# Patient Record
Sex: Male | Born: 1985
Health system: Southern US, Community
[De-identification: ages and names within clinical notes are randomized; demographics above are authoritative.]

## PROBLEM LIST (undated history)

## (undated) DIAGNOSIS — S83529A Sprain of posterior cruciate ligament of unspecified knee, initial encounter: Secondary | ICD-10-CM

## (undated) DIAGNOSIS — J349 Unspecified disorder of nose and nasal sinuses: Secondary | ICD-10-CM

## (undated) DIAGNOSIS — T7840XA Allergy, unspecified, initial encounter: Secondary | ICD-10-CM

## (undated) HISTORY — DX: Unspecified disorder of nose and nasal sinuses: J34.9

## (undated) HISTORY — DX: Allergy, unspecified, initial encounter: T78.40XA

---

## 2014-01-24 ENCOUNTER — Emergency Department (HOSPITAL_COMMUNITY): Payer: Worker's Compensation

## 2014-01-24 ENCOUNTER — Observation Stay (HOSPITAL_COMMUNITY)
Admission: EM | Admit: 2014-01-24 | Discharge: 2014-01-25 | Disposition: A | Discharge status: Home / Self Care | Payer: Worker's Compensation | Attending: General Surgery | Admitting: General Surgery

## 2014-01-24 ENCOUNTER — Encounter (HOSPITAL_COMMUNITY): Payer: Self-pay | Admitting: Emergency Medicine

## 2014-01-24 DIAGNOSIS — S91009A Unspecified open wound, unspecified ankle, initial encounter: Secondary | ICD-10-CM

## 2014-01-24 DIAGNOSIS — Y92410 Unspecified street and highway as the place of occurrence of the external cause: Secondary | ICD-10-CM | POA: Insufficient documentation

## 2014-01-24 DIAGNOSIS — T148XXA Other injury of unspecified body region, initial encounter: Secondary | ICD-10-CM

## 2014-01-24 DIAGNOSIS — S81809A Unspecified open wound, unspecified lower leg, initial encounter: Secondary | ICD-10-CM

## 2014-01-24 DIAGNOSIS — S81009A Unspecified open wound, unspecified knee, initial encounter: Secondary | ICD-10-CM | POA: Insufficient documentation

## 2014-01-24 DIAGNOSIS — R109 Unspecified abdominal pain: Secondary | ICD-10-CM

## 2014-01-24 DIAGNOSIS — Z87891 Personal history of nicotine dependence: Secondary | ICD-10-CM | POA: Insufficient documentation

## 2014-01-24 DIAGNOSIS — S301XXA Contusion of abdominal wall, initial encounter: Principal | ICD-10-CM | POA: Insufficient documentation

## 2014-01-24 DIAGNOSIS — D62 Acute posthemorrhagic anemia: Secondary | ICD-10-CM | POA: Diagnosis present

## 2014-01-24 DIAGNOSIS — S300XXA Contusion of lower back and pelvis, initial encounter: Secondary | ICD-10-CM | POA: Diagnosis present

## 2014-01-24 DIAGNOSIS — S20229A Contusion of unspecified back wall of thorax, initial encounter: Secondary | ICD-10-CM | POA: Insufficient documentation

## 2014-01-24 DIAGNOSIS — M25559 Pain in unspecified hip: Secondary | ICD-10-CM | POA: Insufficient documentation

## 2014-01-24 DIAGNOSIS — M79609 Pain in unspecified limb: Secondary | ICD-10-CM | POA: Insufficient documentation

## 2014-01-24 LAB — CBC WITH DIFFERENTIAL/PLATELET
BASOS PCT: 0 % (ref 0–1)
Basophils Absolute: 0 10*3/uL (ref 0.0–0.1)
EOS ABS: 0 10*3/uL (ref 0.0–0.7)
EOS PCT: 0 % (ref 0–5)
HEMATOCRIT: 40.5 % (ref 39.0–52.0)
HEMOGLOBIN: 12.8 g/dL — AB (ref 13.0–17.0)
Lymphocytes Relative: 9 % — ABNORMAL LOW (ref 12–46)
Lymphs Abs: 0.9 10*3/uL (ref 0.7–4.0)
MCH: 27.1 pg (ref 26.0–34.0)
MCHC: 31.6 g/dL (ref 30.0–36.0)
MCV: 85.8 fL (ref 78.0–100.0)
MONOS PCT: 6 % (ref 3–12)
Monocytes Absolute: 0.7 10*3/uL (ref 0.1–1.0)
Neutro Abs: 9.5 10*3/uL — ABNORMAL HIGH (ref 1.7–7.7)
Neutrophils Relative %: 85 % — ABNORMAL HIGH (ref 43–77)
Platelets: 261 10*3/uL (ref 150–400)
RBC: 4.72 MIL/uL (ref 4.22–5.81)
RDW: 12.7 % (ref 11.5–15.5)
WBC: 11.1 10*3/uL — ABNORMAL HIGH (ref 4.0–10.5)

## 2014-01-24 LAB — PROTIME-INR
INR: 1.05 (ref 0.00–1.49)
PROTHROMBIN TIME: 13.5 s (ref 11.6–15.2)

## 2014-01-24 LAB — COMPREHENSIVE METABOLIC PANEL
ALT: 21 U/L (ref 0–53)
AST: 34 U/L (ref 0–37)
Albumin: 3.8 g/dL (ref 3.5–5.2)
Alkaline Phosphatase: 27 U/L — ABNORMAL LOW (ref 39–117)
BILIRUBIN TOTAL: 0.4 mg/dL (ref 0.3–1.2)
BUN: 10 mg/dL (ref 6–23)
CALCIUM: 8.7 mg/dL (ref 8.4–10.5)
CO2: 26 mEq/L (ref 19–32)
Chloride: 102 mEq/L (ref 96–112)
Creatinine, Ser: 0.96 mg/dL (ref 0.50–1.35)
GFR calc Af Amer: >90 mL/min (ref >90)
GFR calc non Af Amer: >90 mL/min (ref >90)
Glucose, Bld: 114 mg/dL — ABNORMAL HIGH (ref 70–99)
Potassium: 3.9 mEq/L (ref 3.7–5.3)
Sodium: 142 mEq/L (ref 137–147)
Total Protein: 7.1 g/dL (ref 6.0–8.3)

## 2014-01-24 LAB — CBC
HCT: 39.3 % (ref 39.0–52.0)
Hemoglobin: 12.3 g/dL — ABNORMAL LOW (ref 13.0–17.0)
MCH: 26.9 pg (ref 26.0–34.0)
MCHC: 31.3 g/dL (ref 30.0–36.0)
MCV: 86 fL (ref 78.0–100.0)
Platelets: 248 10*3/uL (ref 150–400)
RBC: 4.57 MIL/uL (ref 4.22–5.81)
RDW: 12.8 % (ref 11.5–15.5)
WBC: 8.5 10*3/uL (ref 4.0–10.5)

## 2014-01-24 LAB — URINALYSIS, ROUTINE W REFLEX MICROSCOPIC
Bilirubin Urine: NEGATIVE
GLUCOSE, UA: NEGATIVE mg/dL
HGB URINE DIPSTICK: NEGATIVE
Ketones, ur: NEGATIVE mg/dL
Leukocytes, UA: NEGATIVE
Nitrite: NEGATIVE
PROTEIN: NEGATIVE mg/dL
Specific Gravity, Urine: 1.01 (ref 1.005–1.030)
Urobilinogen, UA: 0.2 mg/dL (ref 0.0–1.0)
pH: 6 (ref 5.0–8.0)

## 2014-01-24 LAB — APTT: aPTT: 27 seconds (ref 24–37)

## 2014-01-24 LAB — LACTIC ACID, PLASMA: Lactic Acid, Venous: 1.9 mmol/L (ref 0.5–2.2)

## 2014-01-24 LAB — I-STAT CG4 LACTIC ACID, ED: Lactic Acid, Venous: 3.34 mmol/L — ABNORMAL HIGH (ref 0.5–2.2)

## 2014-01-24 MED ORDER — ONDANSETRON HCL 4 MG PO TABS
4.0000 mg | ORAL_TABLET | Freq: Four times a day (QID) | ORAL | Status: DC | PRN
  Administered 2014-01-25: 4 mg via ORAL
  Filled 2014-01-24: qty 1

## 2014-01-24 MED ORDER — MORPHINE SULFATE 4 MG/ML IJ SOLN
4.0000 mg | Freq: Once | INTRAMUSCULAR | Status: AC
  Administered 2014-01-24: 4 mg via INTRAVENOUS
  Filled 2014-01-24: qty 1

## 2014-01-24 MED ORDER — IOHEXOL 300 MG/ML  SOLN
100.0000 mL | Freq: Once | INTRAMUSCULAR | Status: AC | PRN
  Administered 2014-01-24: 100 mL via INTRAVENOUS

## 2014-01-24 MED ORDER — SODIUM CHLORIDE 0.9 % IV BOLUS (SEPSIS)
1000.0000 mL | Freq: Once | INTRAVENOUS | Status: AC
  Administered 2014-01-24: 1000 mL via INTRAVENOUS

## 2014-01-24 MED ORDER — ONDANSETRON HCL 4 MG/2ML IJ SOLN: 4.0000 mg | Freq: Four times a day (QID) | INTRAMUSCULAR | Status: DC | PRN

## 2014-01-24 MED ORDER — PANTOPRAZOLE SODIUM 40 MG PO TBEC
40.0000 mg | DELAYED_RELEASE_TABLET | Freq: Every day | ORAL | Status: DC
  Administered 2014-01-24: 40 mg via ORAL
  Filled 2014-01-24: qty 1

## 2014-01-24 MED ORDER — MORPHINE SULFATE 2 MG/ML IJ SOLN
2.0000 mg | INTRAMUSCULAR | Status: DC | PRN
  Administered 2014-01-24 – 2014-01-25 (×3): 2 mg via INTRAVENOUS
  Filled 2014-01-24 (×3): qty 1

## 2014-01-24 MED ORDER — KCL IN DEXTROSE-NACL 20-5-0.9 MEQ/L-%-% IV SOLN
INTRAVENOUS | Status: DC
  Administered 2014-01-24: 22:00:00 via INTRAVENOUS
  Filled 2014-01-24 (×2): qty 1000

## 2014-01-24 MED ORDER — PANTOPRAZOLE SODIUM 40 MG IV SOLR
40.0000 mg | Freq: Every day | INTRAVENOUS | Status: DC
  Filled 2014-01-24: qty 40

## 2014-01-24 NOTE — ED Notes (Signed)
The  tech cleaned the right knee and applied a clean dressing. The tech has reported to the RN in charge.
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
## 2014-01-24 NOTE — ED Notes (Signed)
Dr. Patria Maneampos notified of elevated CG-4

## 2014-01-24 NOTE — ED Notes (Signed)
Patient complaining of feeling lightheaded and dizzy.  He just submitted a urine sample for the lab.  Describes sensation as "head is floating" and "room is spinning".
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
## 2014-01-24 NOTE — ED Notes (Signed)
Lab is at the bedside to collect urine sample.

## 2014-01-24 NOTE — ED Notes (Signed)
Gave the patient juice to drink.

## 2014-01-24 NOTE — ED Notes (Signed)
Discussed with Dr. Clearance CootsHarper that patient is complaining of dizziness and lightheadedness. MD acknowledges and will enter orders.

## 2014-01-24 NOTE — ED Notes (Signed)
Trauma surgeon at the bedside. 
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
## 2014-01-25 ENCOUNTER — Encounter (INDEPENDENT_AMBULATORY_CARE_PROVIDER_SITE_OTHER): Payer: Self-pay | Admitting: Orthopedic Surgery

## 2014-01-25 ENCOUNTER — Encounter (HOSPITAL_COMMUNITY): Payer: Self-pay

## 2014-01-25 DIAGNOSIS — S300XXA Contusion of lower back and pelvis, initial encounter: Secondary | ICD-10-CM | POA: Diagnosis present

## 2014-01-25 DIAGNOSIS — D62 Acute posthemorrhagic anemia: Secondary | ICD-10-CM | POA: Diagnosis present

## 2014-01-25 LAB — COMPREHENSIVE METABOLIC PANEL
ALBUMIN: 3.3 g/dL — AB (ref 3.5–5.2)
ALBUMIN: 3.1 g/dL — AB (ref 3.5–5.2)
ALK PHOS: 18 U/L — AB (ref 39–117)
ALT: 20 U/L (ref 0–53)
ALT: 18 U/L (ref 0–53)
AST: 36 U/L (ref 0–37)
AST: 34 U/L (ref 0–37)
Alkaline Phosphatase: 22 U/L — ABNORMAL LOW (ref 39–117)
BILIRUBIN TOTAL: 0.5 mg/dL (ref 0.3–1.2)
BILIRUBIN TOTAL: 0.7 mg/dL (ref 0.3–1.2)
BUN: 9 mg/dL (ref 6–23)
BUN: 7 mg/dL (ref 6–23)
CALCIUM: 8.6 mg/dL (ref 8.4–10.5)
CHLORIDE: 102 meq/L (ref 96–112)
CO2: 26 mEq/L (ref 19–32)
CO2: 26 mEq/L (ref 19–32)
CREATININE: 0.98 mg/dL (ref 0.50–1.35)
Calcium: 8.3 mg/dL — ABNORMAL LOW (ref 8.4–10.5)
Chloride: 105 mEq/L (ref 96–112)
Creatinine, Ser: 0.99 mg/dL (ref 0.50–1.35)
GFR calc Af Amer: >90 mL/min (ref >90)
GFR calc Af Amer: >90 mL/min (ref >90)
GFR calc non Af Amer: >90 mL/min (ref >90)
GFR calc non Af Amer: >90 mL/min (ref >90)
GLUCOSE: 132 mg/dL — AB (ref 70–99)
Glucose, Bld: 125 mg/dL — ABNORMAL HIGH (ref 70–99)
POTASSIUM: 4.3 meq/L (ref 3.7–5.3)
Potassium: 4.4 mEq/L (ref 3.7–5.3)
Sodium: 141 mEq/L (ref 137–147)
Sodium: 142 mEq/L (ref 137–147)
Total Protein: 6.4 g/dL (ref 6.0–8.3)
Total Protein: 6.1 g/dL (ref 6.0–8.3)

## 2014-01-25 LAB — CBC
HEMATOCRIT: 37.7 % — AB (ref 39.0–52.0)
HEMOGLOBIN: 12 g/dL — AB (ref 13.0–17.0)
MCH: 27.1 pg (ref 26.0–34.0)
MCHC: 31.8 g/dL (ref 30.0–36.0)
MCV: 85.1 fL (ref 78.0–100.0)
Platelets: 238 10*3/uL (ref 150–400)
RBC: 4.43 MIL/uL (ref 4.22–5.81)
RDW: 13 % (ref 11.5–15.5)
WBC: 6.9 10*3/uL (ref 4.0–10.5)

## 2014-01-25 MED ORDER — MORPHINE SULFATE 2 MG/ML IJ SOLN: 2.0000 mg | INTRAMUSCULAR | Status: DC | PRN

## 2014-01-25 MED ORDER — BACITRACIN-NEOMYCIN-POLYMYXIN 400-5-5000 EX OINT
TOPICAL_OINTMENT | CUTANEOUS | Status: AC
  Administered 2014-01-25: 14:00:00
  Filled 2014-01-25: qty 1

## 2014-01-25 MED ORDER — NAPROXEN 500 MG PO TABS: 500.0000 mg | ORAL_TABLET | Freq: Two times a day (BID) | ORAL | Status: DC

## 2014-01-25 MED ORDER — TRAMADOL HCL 50 MG PO TABS: 50.0000 mg | ORAL_TABLET | Freq: Four times a day (QID) | ORAL | Status: DC | PRN

## 2014-01-25 MED ORDER — TRAMADOL HCL 50 MG PO TABS
50.0000 mg | ORAL_TABLET | Freq: Four times a day (QID) | ORAL | Status: DC | PRN
  Administered 2014-01-25 (×2): 100 mg via ORAL
  Filled 2014-01-25 (×2): qty 2

## 2014-01-25 MED ORDER — NAPROXEN 500 MG PO TABS
500.0000 mg | ORAL_TABLET | Freq: Two times a day (BID) | ORAL | Status: DC
  Administered 2014-01-25: 500 mg via ORAL
  Filled 2014-01-25 (×4): qty 1
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
## 2014-01-25 NOTE — Progress Notes (Signed)
While awaiting discharge, pt. Vomited large amt of undigested food. Notified Charma IgoMichael Jeffery, PA-C. Received order to proceed with discharge. Administered Zofran tablet prior to leaving the unit. While transporting to his car the patient vomited approximately 400-500 cc of emesis. He admitted to "overeating" a heavy lunch but also felt his nausea was related to feeling flushed and hot and was worse with ambulation. Informed patient to return to the ED if the nausea continued. Discharged home accompanied by girlfriend and family members. Malen Gauzearol Emmalee Solivan, RN

## 2014-01-25 NOTE — Progress Notes (Signed)
Agree that the patient should be able to go home.  This patient has been seen and I agree with the findings and treatment plan.  Marta LamasJames O. Gae BonWyatt, III, MD, FACS [phone] (pager) [phone] (direct pager) Trauma Surgeon
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
## 2014-02-06 ENCOUNTER — Telehealth (HOSPITAL_COMMUNITY): Payer: Self-pay

## 2014-02-07 ENCOUNTER — Ambulatory Visit (INDEPENDENT_AMBULATORY_CARE_PROVIDER_SITE_OTHER): Payer: Worker's Compensation | Admitting: General Surgery

## 2014-02-07 ENCOUNTER — Encounter (INDEPENDENT_AMBULATORY_CARE_PROVIDER_SITE_OTHER): Payer: Self-pay

## 2014-02-07 VITALS — BP 130/80 | HR 78 | Temp 97.0°F | Ht 70.0 in | Wt 238.0 lb

## 2014-02-07 DIAGNOSIS — M549 Dorsalgia, unspecified: Secondary | ICD-10-CM

## 2014-02-07 DIAGNOSIS — M25569 Pain in unspecified knee: Secondary | ICD-10-CM
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
## 2014-02-07 NOTE — Telephone Encounter (Signed)
Left message
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
## 2014-02-08 ENCOUNTER — Other Ambulatory Visit: Payer: Self-pay | Admitting: Occupational Medicine

## 2014-02-08 ENCOUNTER — Ambulatory Visit: Payer: Worker's Compensation

## 2014-02-08 DIAGNOSIS — R52 Pain, unspecified: Secondary | ICD-10-CM

## 2015-01-04 ENCOUNTER — Emergency Department (HOSPITAL_COMMUNITY)
Admission: EM | Admit: 2015-01-04 | Discharge: 2015-01-04 | Disposition: A | Discharge status: Home / Self Care | Payer: BLUE CROSS/BLUE SHIELD | Attending: Emergency Medicine | Admitting: Emergency Medicine

## 2015-01-04 ENCOUNTER — Encounter (HOSPITAL_COMMUNITY): Payer: Self-pay | Admitting: Emergency Medicine

## 2015-01-04 DIAGNOSIS — R42 Dizziness and giddiness: Secondary | ICD-10-CM | POA: Insufficient documentation

## 2015-01-04 DIAGNOSIS — Z87828 Personal history of other (healed) physical injury and trauma: Secondary | ICD-10-CM | POA: Diagnosis not present

## 2015-01-04 DIAGNOSIS — Z791 Long term (current) use of non-steroidal anti-inflammatories (NSAID): Secondary | ICD-10-CM | POA: Diagnosis not present

## 2015-01-04 DIAGNOSIS — R112 Nausea with vomiting, unspecified: Secondary | ICD-10-CM | POA: Diagnosis not present

## 2015-01-04 HISTORY — DX: Sprain of posterior cruciate ligament of unspecified knee, initial encounter: S83.529A

## 2015-01-04 LAB — COMPREHENSIVE METABOLIC PANEL
ALT: 27 U/L (ref 0–53)
AST: 21 U/L (ref 0–37)
Albumin: 3.8 g/dL (ref 3.5–5.2)
Alkaline Phosphatase: 17 U/L — ABNORMAL LOW (ref 39–117)
Anion gap: 7 (ref 5–15)
BILIRUBIN TOTAL: 0.8 mg/dL (ref 0.3–1.2)
BUN: 10 mg/dL (ref 6–23)
CO2: 29 mmol/L (ref 19–32)
Calcium: 9.3 mg/dL (ref 8.4–10.5)
Chloride: 104 mmol/L (ref 96–112)
Creatinine, Ser: 1.01 mg/dL (ref 0.50–1.35)
GFR calc Af Amer: >90 mL/min (ref >90)
Glucose, Bld: 110 mg/dL — ABNORMAL HIGH (ref 70–99)
Potassium: 4.2 mmol/L (ref 3.5–5.1)
Sodium: 140 mmol/L (ref 135–145)
Total Protein: 6.9 g/dL (ref 6.0–8.3)

## 2015-01-04 LAB — CBC WITH DIFFERENTIAL/PLATELET
Basophils Absolute: 0 10*3/uL (ref 0.0–0.1)
Basophils Relative: 0 % (ref 0–1)
EOS ABS: 0 10*3/uL (ref 0.0–0.7)
EOS PCT: 0 % (ref 0–5)
HCT: 42.1 % (ref 39.0–52.0)
Hemoglobin: 13.3 g/dL (ref 13.0–17.0)
LYMPHS ABS: 1 10*3/uL (ref 0.7–4.0)
Lymphocytes Relative: 21 % (ref 12–46)
MCH: 26.6 pg (ref 26.0–34.0)
MCHC: 31.6 g/dL (ref 30.0–36.0)
MCV: 84.2 fL (ref 78.0–100.0)
Monocytes Absolute: 0.3 10*3/uL (ref 0.1–1.0)
Monocytes Relative: 6 % (ref 3–12)
Neutro Abs: 3.6 10*3/uL (ref 1.7–7.7)
Neutrophils Relative %: 73 % (ref 43–77)
PLATELETS: 342 10*3/uL (ref 150–400)
RBC: 5 MIL/uL (ref 4.22–5.81)
RDW: 13 % (ref 11.5–15.5)
WBC: 5 10*3/uL (ref 4.0–10.5)

## 2015-01-04 LAB — URINALYSIS, ROUTINE W REFLEX MICROSCOPIC
BILIRUBIN URINE: NEGATIVE
GLUCOSE, UA: NEGATIVE mg/dL
Hgb urine dipstick: NEGATIVE
KETONES UR: NEGATIVE mg/dL
Leukocytes, UA: NEGATIVE
Nitrite: NEGATIVE
PH: 7 (ref 5.0–8.0)
Protein, ur: NEGATIVE mg/dL
SPECIFIC GRAVITY, URINE: 1.023 (ref 1.005–1.030)
Urobilinogen, UA: 0.2 mg/dL (ref 0.0–1.0)

## 2015-01-04 LAB — RAPID URINE DRUG SCREEN, HOSP PERFORMED
Amphetamines: NOT DETECTED
BENZODIAZEPINES: NOT DETECTED
Barbiturates: NOT DETECTED
COCAINE: NOT DETECTED
Opiates: NOT DETECTED
TETRAHYDROCANNABINOL: NOT DETECTED

## 2015-01-04 LAB — LIPASE, BLOOD: Lipase: 26 U/L (ref 11–59)

## 2015-01-04 LAB — CK: CK TOTAL: 114 U/L (ref 7–232)

## 2015-01-04 MED ORDER — MECLIZINE HCL 12.5 MG PO TABS: 12.5000 mg | ORAL_TABLET | Freq: Three times a day (TID) | ORAL | Status: DC | PRN

## 2015-01-04 MED ORDER — ONDANSETRON HCL 4 MG/2ML IJ SOLN
4.0000 mg | Freq: Once | INTRAMUSCULAR | Status: AC
  Administered 2015-01-04: 4 mg via INTRAVENOUS
  Filled 2015-01-04: qty 2

## 2015-01-04 MED ORDER — SODIUM CHLORIDE 0.9 % IV BOLUS (SEPSIS)
1000.0000 mL | Freq: Once | INTRAVENOUS | Status: AC
  Administered 2015-01-04: 1000 mL via INTRAVENOUS

## 2015-01-04 MED ORDER — ONDANSETRON 4 MG PO TBDP: 4.0000 mg | ORAL_TABLET | Freq: Three times a day (TID) | ORAL | Status: DC | PRN

## 2015-01-04 MED ORDER — MECLIZINE HCL 25 MG PO TABS
25.0000 mg | ORAL_TABLET | Freq: Once | ORAL | Status: AC
  Administered 2015-01-04: 25 mg via ORAL
  Filled 2015-01-04: qty 1

## 2015-01-04 NOTE — Discharge Instructions (Signed)
Dizziness °Dizziness is a common problem. It is a feeling of unsteadiness or light-headedness. You may feel like you are about to faint. Dizziness can lead to injury if you stumble or fall. A person of any age group can suffer from dizziness, but dizziness is more common in older adults. °CAUSES  °Dizziness can be caused by many different things, including: °· Middle ear problems. °· Standing for too long. °· Infections. °· An allergic reaction. °· Aging. °· An emotional response to something, such as the sight of blood. °· Side effects of medicines. °· Tiredness. °· Problems with circulation or blood pressure. °· Excessive use of alcohol or medicines, or illegal drug use. °· Breathing too fast (hyperventilation). °· An irregular heart rhythm (arrhythmia). °· A low red blood cell count (anemia). °· Pregnancy. °· Vomiting, diarrhea, fever, or other illnesses that cause body fluid loss (dehydration). °· Diseases or conditions such as Parkinson's disease, high blood pressure (hypertension), diabetes, and thyroid problems. °· Exposure to extreme heat. °DIAGNOSIS  °Your health care provider will ask about your symptoms, perform a physical exam, and perform an electrocardiogram (ECG) to record the electrical activity of your heart. Your health care provider may also perform other heart or blood tests to determine the cause of your dizziness. These may include: °· Transthoracic echocardiogram (TTE). During echocardiography, sound waves are used to evaluate how blood flows through your heart. °· Transesophageal echocardiogram (TEE). °· Cardiac monitoring. This allows your health care provider to monitor your heart rate and rhythm in real time. °· Holter monitor. This is a portable device that records your heartbeat and can help diagnose heart arrhythmias. It allows your health care provider to track your heart activity for several days if needed. °· Stress tests by exercise or by giving medicine that makes the heart beat  faster. °TREATMENT  °Treatment of dizziness depends on the cause of your symptoms and can vary greatly. °HOME CARE INSTRUCTIONS  °· Drink enough fluids to keep your urine clear or pale yellow. This is especially important in very hot weather. In older adults, it is also important in cold weather. °· Take your medicine exactly as directed if your dizziness is caused by medicines. When taking blood pressure medicines, it is especially important to get up slowly. °¨ Rise slowly from chairs and steady yourself until you feel okay. °¨ In the morning, first sit up on the side of the bed. When you feel okay, stand slowly while holding onto something until you know your balance is fine. °· Move your legs often if you need to stand in one place for a long time. Tighten and relax your muscles in your legs while standing. °· Have someone stay with you for 1-2 days if dizziness continues to be a problem. Do this until you feel you are well enough to stay alone. Have the person call your health care provider if he or she notices changes in you that are concerning. °· Do not drive or use heavy machinery if you feel dizzy. °· Do not drink alcohol. °SEEK IMMEDIATE MEDICAL CARE IF:  °· Your dizziness or light-headedness gets worse. °· You feel nauseous or vomit. °· You have problems talking, walking, or using your arms, hands, or legs. °· You feel weak. °· You are not thinking clearly or you have trouble forming sentences. It may take a friend or family member to notice this. °· You have chest pain, abdominal pain, shortness of breath, or sweating. °· Your vision changes. °· You notice   any bleeding. °· You have side effects from medicine that seems to be getting worse rather than better. °MAKE SURE YOU:  °· Understand these instructions. °· Will watch your condition. °· Will get help right away if you are not doing well or get worse. °Document Released: 03/17/2001 Document Revised: 09/26/2013 Document Reviewed: 04/10/2011 °ExitCare®  Patient Information ©2015 [employer]. This information is not intended to replace advice given to you by your health care provider. Make sure you discuss any questions you have with your health care provider. °Nausea and Vomiting °Nausea is a sick feeling that often comes before throwing up (vomiting). Vomiting is a reflex where stomach contents come out of your mouth. Vomiting can cause severe loss of body fluids (dehydration). Children and elderly adults can become dehydrated quickly, especially if they also have diarrhea. Nausea and vomiting are symptoms of a condition or disease. It is important to find the cause of your symptoms. °CAUSES  °· Direct irritation of the stomach lining. This irritation can result from increased acid production (gastroesophageal reflux disease), infection, food poisoning, taking certain medicines (such as nonsteroidal anti-inflammatory drugs), alcohol use, or tobacco use. °· Signals from the brain. These signals could be caused by a headache, heat exposure, an inner ear disturbance, increased pressure in the brain from injury, infection, a tumor, or a concussion, pain, emotional stimulus, or metabolic problems. °· An obstruction in the gastrointestinal tract (bowel obstruction). °· Illnesses such as diabetes, hepatitis, gallbladder problems, appendicitis, kidney problems, cancer, sepsis, atypical symptoms of a heart attack, or eating disorders. °· Medical treatments such as chemotherapy and radiation. °· Receiving medicine that makes you sleep (general anesthetic) during surgery. °DIAGNOSIS °Your caregiver may ask for tests to be done if the problems do not improve after a few days. Tests may also be done if symptoms are severe or if the reason for the nausea and vomiting is not clear. Tests may include: °· Urine tests. °· Blood tests. °· Stool tests. °· Cultures (to look for evidence of infection). °· X-rays or other imaging studies. °Test results can help your caregiver make  decisions about treatment or the need for additional tests. °TREATMENT °You need to stay well hydrated. Drink frequently but in small amounts. You may wish to drink water, sports drinks, clear broth, or eat frozen ice pops or gelatin dessert to help stay hydrated. When you eat, eating slowly may help prevent nausea. There are also some antinausea medicines that may help prevent nausea. °HOME CARE INSTRUCTIONS  °· Take all medicine as directed by your caregiver. °· If you do not have an appetite, do not force yourself to eat. However, you must continue to drink fluids. °· If you have an appetite, eat a normal diet unless your caregiver tells you differently. °¨ Eat a variety of complex carbohydrates (rice, wheat, potatoes, bread), lean meats, yogurt, fruits, and vegetables. °¨ Avoid high-fat foods because they are more difficult to digest. °· Drink enough water and fluids to keep your urine clear or pale yellow. °· If you are dehydrated, ask your caregiver for specific rehydration instructions. Signs of dehydration may include: °¨ Severe thirst. °¨ Dry lips and mouth. °¨ Dizziness. °¨ Dark urine. °¨ Decreasing urine frequency and amount. °¨ Confusion. °¨ Rapid breathing or pulse. °SEEK IMMEDIATE MEDICAL CARE IF:  °· You have blood or brown flecks (like coffee grounds) in your vomit. °· You have black or bloody stools. °· You have a severe headache or stiff neck. °· You are confused. °· You have severe abdominal pain. °·   You have chest pain or trouble breathing. °· You do not urinate at least once every 8 hours. °· You develop cold or clammy skin. °· You continue to vomit for longer than 24 to 48 hours. °· You have a fever. °MAKE SURE YOU:  °· Understand these instructions. °· Will watch your condition. °· Will get help right away if you are not doing well or get worse. °Document Released: 09/21/2005 Document Revised: 12/14/2011 Document Reviewed: 02/18/2011 °ExitCare® Patient Information ©2015 [employer]. This  information is not intended to replace advice given to you by your health care provider. Make sure you discuss any questions you have with your health care provider. ° °

## 2015-01-04 NOTE — ED Notes (Signed)
Pt states started feeeling dizzy yesterday, worse this am, with vomiting with movement. No diarrhea, no appetite,

## 2015-01-04 NOTE — ED Notes (Signed)
Patient able to ambulate without difficulty. Pt has steady gait. Denies lightheadedness or dizziness.
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
## 2015-01-04 NOTE — ED Notes (Signed)
Patient reevaluated.   Patient states feels same as when he came in.

## 2015-01-04 NOTE — ED Notes (Signed)
Able to tolerate PO fluids.

## 2015-04-20 ENCOUNTER — Emergency Department (HOSPITAL_COMMUNITY)
Admission: EM | Admit: 2015-04-20 | Discharge: 2015-04-20 | Disposition: A | Discharge status: Home / Self Care | Payer: BLUE CROSS/BLUE SHIELD | Attending: Emergency Medicine | Admitting: Emergency Medicine

## 2015-04-20 ENCOUNTER — Emergency Department (HOSPITAL_COMMUNITY): Payer: BLUE CROSS/BLUE SHIELD

## 2015-04-20 ENCOUNTER — Encounter (HOSPITAL_COMMUNITY): Payer: Self-pay | Admitting: *Deleted

## 2015-04-20 DIAGNOSIS — S62002A Unspecified fracture of navicular [scaphoid] bone of left wrist, initial encounter for closed fracture: Secondary | ICD-10-CM | POA: Diagnosis not present

## 2015-04-20 DIAGNOSIS — Y92310 Basketball court as the place of occurrence of the external cause: Secondary | ICD-10-CM | POA: Diagnosis not present

## 2015-04-20 DIAGNOSIS — Y998 Other external cause status: Secondary | ICD-10-CM | POA: Diagnosis not present

## 2015-04-20 DIAGNOSIS — Y9367 Activity, basketball: Secondary | ICD-10-CM | POA: Diagnosis not present

## 2015-04-20 DIAGNOSIS — W2105XA Struck by basketball, initial encounter: Secondary | ICD-10-CM | POA: Insufficient documentation

## 2015-04-20 DIAGNOSIS — S62002S Unspecified fracture of navicular [scaphoid] bone of left wrist, sequela: Secondary | ICD-10-CM

## 2015-04-20 DIAGNOSIS — S6992XA Unspecified injury of left wrist, hand and finger(s), initial encounter: Secondary | ICD-10-CM | POA: Diagnosis present

## 2015-04-20 NOTE — Discharge Instructions (Signed)
Where your wrist splint until you no longer have pain. Follow-up with Dr. Amanda PeaGramig if you continue to have pain. Take Tylenol or Motrin for pain.

## 2015-04-20 NOTE — ED Provider Notes (Signed)
CSN: [id number]     Arrival date & time 04/20/15  1706 History  This chart was scribed for Catha Gosselin, PA-C, working with Samuel Jester, DO by Elon Spanner, ED Scribe. This patient was seen in room TR08C/TR08C and the patient's care was started at 5:41 PM.   Chief Complaint  Patient presents with  . Hand Injury   The history is provided by the patient. No language interpreter was used.   HPI Comments: Anthony Durham is a 29 y.o. male who presents to the Emergency Department complaining of a left wrist injury onset 4 hours ago with associated constant aching pain.  The patient reports he was playing basketball when he fell and landed on his outstretched right hand.  He has not taken anything for pain.  Patient reports a prior history of left wrist fracture, but denies surgical history.    PCP: none   Past Medical History  Diagnosis Date  . Tear of PCL (posterior cruciate ligament) of knee    History reviewed. No pertinent past surgical history. History reviewed. No pertinent family history. History  Substance Use Topics  . Smoking status: Never Smoker   . Smokeless tobacco: Never Used  . Alcohol Use: No    Review of Systems  Constitutional: Negative for fever.  Musculoskeletal: Positive for joint swelling and arthralgias.      Allergies  Review of patient's allergies indicates no known allergies.  Home Medications   Prior to Admission medications   Not on File   BP 112/75 mmHg  Pulse 68  Temp(Src) 98.9 F (37.2 C) (Oral)  Resp 14  SpO2 100% Physical Exam  Constitutional: He is oriented to person, place, and time. He appears well-developed and well-nourished. No distress.  HENT:  Head: Normocephalic and atraumatic.  Eyes: Conjunctivae and EOM are normal.  Neck: Neck supple. No tracheal deviation present.  Cardiovascular: Normal rate.   Good radial pulses.   Pulmonary/Chest: Effort normal. No respiratory distress.  Musculoskeletal: Normal range of motion.   Left hand: Able to flex/extend fingers without difficulty.  No snuff box tenderness.  No pallor.  Neurological: He is alert and oriented to person, place, and time.  Strength 5/5 in left hand.  NVI.  Skin: Skin is warm and dry.  Psychiatric: He has a normal mood and affect. His behavior is normal.  Nursing note and vitals reviewed.   ED Course  Procedures (including critical care time)  DIAGNOSTIC STUDIES: Oxygen Saturation is 98% on RA, normal by my interpretation.    COORDINATION OF CARE:  5:54 PM Discussed treatment plan with patient at bedside.  Patient acknowledges and agrees with plan.    Labs Review Labs Reviewed - No data to display  Imaging Review Dg Wrist Complete Left  04/20/2015   CLINICAL DATA:  Basketball injury.  EXAM: LEFT WRIST - COMPLETE 3+ VIEW  COMPARISON:  None.  FINDINGS: There is chronic appearing fracture deformity involving the scaphoid bone. Mild distraction of the fracture fragments noted. There is increased density of the distal pole which may indicate necrosis. No acute fractures or subluxations.  IMPRESSION: 1. Fracture involving the waist of the scaphoid bone is likely chronic with possible necrosis of the distal pole. No acute fractures or subluxations noted.   Electronically Signed   By: Signa Kell M.D.   On: 04/20/2015 17:55     EKG Interpretation None      MDM   Final diagnoses:  Scaphoid fracture of wrist, left, sequela  Patient is not in  any acute distress. He was given an ice pack. He refused pain medications. Exam was normal. Patient was seen by Dr. Amanda PeaGramig due to scaphoid fracture. He suggested putting the patient in a left wrist immobilizer until he no longer had pain. He also gave the patient follow-up if he continued to have problems with the wrist.  I personally performed the services described in this documentation, which was scribed in my presence. The recorded information has been reviewed and is accurate.   Catha GosselinHanna  Patel-Mills, PA-C 04/21/15 0155  Samuel JesterKathleen McManus, DO 04/23/15 2208

## 2015-04-20 NOTE — ED Notes (Signed)
Pt reports playing ball and falling, now has left wrist pain. No obv injury noted.

## 2015-04-20 NOTE — Consult Note (Signed)
Reason for Consult: Left wrist pain Referring Physician: ER staff  Carlynn SpryJazman Walrath is an 29 y.o. male.  HPI: Patient presents with left wrist pain after follow-up plain outdoor activities. He denies locking popping catching. He states she has pain about the dorsal radial aspect of his wrist.  He has a remote history of injury and fracture. He states when he was playing high school football he was casted for a scaphoid fracture. This appears to never healed and are pinning his medical professionals today.  He does not have any gross swelling or numbness. He denies other injury. He and I discussed these issues at length.  It appears that he had a remote injury and was reinjured today.  It appears she is always a little bit of problem with the wrist but certainly worsening did occur today after the fall.  He states he works for past [employer]. He is a good historian  Past Medical History  Diagnosis Date  . Tear of PCL (posterior cruciate ligament) of knee     History reviewed. No pertinent past surgical history.  History reviewed. No pertinent family history.  Social History:  reports that he has never smoked. He has never used smokeless tobacco. He reports that he does not drink alcohol or use illicit drugs.  Allergies: No Known Allergies  Medications: I have reviewed the patient's current medications.  No results found for this or any previous visit (from the past 48 hour(s)).  Dg Wrist Complete Left  04/20/2015   CLINICAL DATA:  Basketball injury.  EXAM: LEFT WRIST - COMPLETE 3+ VIEW  COMPARISON:  None.  FINDINGS: There is chronic appearing fracture deformity involving the scaphoid bone. Mild distraction of the fracture fragments noted. There is increased density of the distal pole which may indicate necrosis. No acute fractures or subluxations.  IMPRESSION: 1. Fracture involving the waist of the scaphoid bone is likely chronic with possible necrosis of the distal pole. No acute  fractures or subluxations noted.   Electronically Signed   By: Signa Kellaylor  Stroud M.D.   On: 04/20/2015 17:55    ROS Blood pressure 121/74, pulse 88, temperature 98.2 F (36.8 C), temperature source Oral, resp. rate 20, SpO2 98 %. Physical Exam left wrist is intact sensation and motor function. There's no signs of compartment and rhythm or infection. There is no evidence of instability. I reviewed this with him at length and the findings. He is mildly tender over the snuffbox. CMC joints are stable. Elbow and forearm are nontender.  The patient is alert and oriented in no acute distress. The patient complains of pain in the affected upper extremity.  The patient is noted to have a normal HEENT exam. Lung fields show equal chest expansion and no shortness of breath. Abdomen exam is nontender without distention. Lower extremity examination does not show any fracture dislocation or blood clot symptoms. Pelvis is stable and the neck and back are stable and nontender.  Assessment/Plan: Scaphoid nonunion left wrist. This patient has early proximal and distal pole necrosis with degenerative change. I brought up the x-rays in the computer and showed the x-rays to the patient. I discussed with him that unfortunately he had a bone which was broken years ago and ultimately never healed. This ultimately change the dynamics of his wrist. He now has a poor balance in terms of his wrist stability issue.  I discussed in that unfortunately he has early cartilage wear and what appears to be a scaphoid which does not have  good blood flow. I went over the precarious chair blood flow to the scaphoid and its tendency towards nonunion. I discussed with him options.  I feel that he has reinjured an old injury. This would be known as a acute exacerbation of his chronic condition.  At present time I recommend immobilization, Rice( rest ice compression elevation), and a see if in fact this returns to its usual state of  affairs.  If he continues to have pain and problems one would have to consider a scaphoid excision and 4 corner fusion. Other optional be a proximal row carpectomy. I do not feel he would be a good candidate for attempted scaphoid reconstruction given the timeframe duration from injury and the changes in his scaphoid poles as well as early degenerative change.  We will do this at length. I gave him my business card and wrote down his diagnosis so that he could  familiarize himself with the diagnosis and options.    Karen Chafe 04/20/2015, 7:47 PM

## 2016-03-26 ENCOUNTER — Other Ambulatory Visit: Payer: Self-pay

## 2016-03-26 ENCOUNTER — Emergency Department (HOSPITAL_COMMUNITY)
Admission: EM | Admit: 2016-03-26 | Discharge: 2016-03-26 | Disposition: A | Discharge status: Home / Self Care | Payer: BLUE CROSS/BLUE SHIELD | Attending: Emergency Medicine | Admitting: Emergency Medicine

## 2016-03-26 ENCOUNTER — Emergency Department (HOSPITAL_COMMUNITY): Payer: BLUE CROSS/BLUE SHIELD

## 2016-03-26 ENCOUNTER — Encounter (HOSPITAL_COMMUNITY): Payer: Self-pay | Admitting: Emergency Medicine

## 2016-03-26 DIAGNOSIS — R112 Nausea with vomiting, unspecified: Secondary | ICD-10-CM

## 2016-03-26 DIAGNOSIS — R509 Fever, unspecified: Secondary | ICD-10-CM | POA: Diagnosis not present

## 2016-03-26 DIAGNOSIS — R55 Syncope and collapse: Secondary | ICD-10-CM

## 2016-03-26 DIAGNOSIS — R1084 Generalized abdominal pain: Secondary | ICD-10-CM | POA: Diagnosis not present

## 2016-03-26 LAB — HEPATIC FUNCTION PANEL
ALT: 30 U/L (ref 17–63)
AST: 21 U/L (ref 15–41)
Albumin: 4.1 g/dL (ref 3.5–5.0)
Alkaline Phosphatase: 16 U/L — ABNORMAL LOW (ref 38–126)
Bilirubin, Direct: <0.1 mg/dL — ABNORMAL LOW (ref 0.1–0.5)
TOTAL PROTEIN: 7.1 g/dL (ref 6.5–8.1)
Total Bilirubin: 1.1 mg/dL (ref 0.3–1.2)

## 2016-03-26 LAB — BASIC METABOLIC PANEL
Anion gap: 9 (ref 5–15)
BUN: 12 mg/dL (ref 6–20)
CO2: 27 mmol/L (ref 22–32)
CREATININE: 1.17 mg/dL (ref 0.61–1.24)
Calcium: 8.9 mg/dL (ref 8.9–10.3)
Chloride: 103 mmol/L (ref 101–111)
GFR calc Af Amer: >60 mL/min (ref >60)
Glucose, Bld: 99 mg/dL (ref 65–99)
Potassium: 3.5 mmol/L (ref 3.5–5.1)
SODIUM: 139 mmol/L (ref 135–145)

## 2016-03-26 LAB — CBC
HCT: 43.6 % (ref 39.0–52.0)
Hemoglobin: 13.9 g/dL (ref 13.0–17.0)
MCH: 26.2 pg (ref 26.0–34.0)
MCHC: 31.9 g/dL (ref 30.0–36.0)
MCV: 82.3 fL (ref 78.0–100.0)
PLATELETS: 236 10*3/uL (ref 150–400)
RBC: 5.3 MIL/uL (ref 4.22–5.81)
RDW: 12.9 % (ref 11.5–15.5)
WBC: 8.5 10*3/uL (ref 4.0–10.5)

## 2016-03-26 LAB — CBG MONITORING, ED: GLUCOSE-CAPILLARY: 93 mg/dL (ref 65–99)

## 2016-03-26 LAB — LACTIC ACID, PLASMA: LACTIC ACID, VENOUS: 1.2 mmol/L (ref 0.5–2.0)

## 2016-03-26 MED ORDER — HYDROMORPHONE HCL 1 MG/ML IJ SOLN
1.0000 mg | Freq: Once | INTRAMUSCULAR | Status: DC
  Filled 2016-03-26: qty 1

## 2016-03-26 MED ORDER — IOPAMIDOL (ISOVUE-300) INJECTION 61%
100.0000 mL | Freq: Once | INTRAVENOUS | Status: AC | PRN
  Administered 2016-03-26: 100 mL via INTRAVENOUS

## 2016-03-26 MED ORDER — DIATRIZOATE MEGLUMINE & SODIUM 66-10 % PO SOLN: 15.0000 mL | ORAL | Status: DC | PRN

## 2016-03-26 MED ORDER — SODIUM CHLORIDE 0.9 % IV BOLUS (SEPSIS)
1000.0000 mL | Freq: Once | INTRAVENOUS | Status: AC
  Administered 2016-03-26: 1000 mL via INTRAVENOUS

## 2016-03-26 MED ORDER — HYDROCODONE-ACETAMINOPHEN 5-325 MG PO TABS: 2.0000 | ORAL_TABLET | ORAL | Status: DC | PRN

## 2016-03-26 MED ORDER — ONDANSETRON HCL 4 MG PO TABS: 4.0000 mg | ORAL_TABLET | Freq: Four times a day (QID) | ORAL | Status: DC

## 2016-03-26 MED ORDER — ONDANSETRON HCL 4 MG/2ML IJ SOLN
4.0000 mg | Freq: Once | INTRAMUSCULAR | Status: AC
  Administered 2016-03-26: 4 mg via INTRAVENOUS
  Filled 2016-03-26: qty 2

## 2016-03-26 MED ORDER — ACETAMINOPHEN 325 MG PO TABS: 650.0000 mg | ORAL_TABLET | Freq: Once | ORAL | Status: DC

## 2016-03-26 MED ORDER — ACETAMINOPHEN 325 MG PO TABS
650.0000 mg | ORAL_TABLET | Freq: Once | ORAL | Status: AC
  Administered 2016-03-26: 650 mg via ORAL
  Filled 2016-03-26: qty 2

## 2016-03-26 NOTE — Discharge Instructions (Signed)
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
## 2016-03-26 NOTE — ED Notes (Signed)
Patient transported to CT 
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
## 2016-03-26 NOTE — ED Notes (Signed)
Pt cannot use restroom at this time, aware urine specimen is needed.  
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
## 2018-05-12 ENCOUNTER — Ambulatory Visit (INDEPENDENT_AMBULATORY_CARE_PROVIDER_SITE_OTHER): Payer: BLUE CROSS/BLUE SHIELD | Admitting: Medical

## 2018-05-12 ENCOUNTER — Encounter: Payer: Self-pay | Admitting: Medical

## 2018-05-12 VITALS — BP 136/80 | HR 86 | Temp 98.4°F | Resp 16 | Ht 69.75 in | Wt 257.0 lb

## 2018-05-12 DIAGNOSIS — R42 Dizziness and giddiness: Secondary | ICD-10-CM

## 2018-05-12 DIAGNOSIS — J329 Chronic sinusitis, unspecified: Secondary | ICD-10-CM | POA: Insufficient documentation

## 2018-05-12 DIAGNOSIS — J301 Allergic rhinitis due to pollen: Secondary | ICD-10-CM

## 2018-05-12 MED ORDER — MONTELUKAST SODIUM 10 MG PO TABS: 10.0000 mg | ORAL_TABLET | Freq: Every day | ORAL | 3 refills | Status: AC

## 2018-05-12 MED ORDER — BECLOMETHASONE DIPROPIONATE 80 MCG/ACT NA AERS: 1.0000 | INHALATION_SPRAY | Freq: Two times a day (BID) | NASAL | 5 refills | Status: AC

## 2018-05-12 NOTE — Progress Notes (Signed)
Subjective: Chief Complaint  Patient presents with  . np    get established care, follow up vertigo   Here as a new patient to get established.  He notes going to the emergency dept in early July for vertigo.   Second time he has had this in 2 years.  He notes some slight dizzy spells, but has improved.   If gets up fast gets some lightheadedness.   At the ED, was given medication to take.  Was advised to f/u with PCP if not improving.  He notes that the medication didn't help that much.  Couldn't drive or work while on the medication.  He notes awakening few weeks ago with feeling like room spinning or like being on a boat, followed by nausea.   Had some some sinus pressure preceding the symptoms.    No CP, no SOB, no edema.  No weakness, no numbness, no tingling.   He notes in 2017 when he had vertigo, was also dehydrated, was given IV fluids and Zofran.  He lost 50lb intentionally in recent months, feels a lot better in general.   Good about drinking plenty of water throughout the day.  Not drinking alcohol, nonsmoker.   No drug use.     Has chronic allergy problems year round, has tried numerous medications prior.    Currently stopped up in left nostril.    Past Medical History:  Diagnosis Date  . Allergy    year round  . Sinus problem    3-4 sinusitis per year, self reported 05/2018  . Tear of PCL (posterior cruciate ligament) of knee    No current outpatient medications on file prior to visit.   No current facility-administered medications on file prior to visit.    ROS as in subjective     Objective: BP 136/80   Pulse 86   Temp 98.4 F (36.9 C) (Oral)   Resp 16   Ht 5' 9.75" (1.772 m)   Wt 257 lb (116.6 kg)   SpO2 98%   BMI 37.14 kg/m   Wt Readings from Last 3 Encounters:  05/12/18 257 lb (116.6 kg)  01/04/15 255 lb (115.7 kg)  02/07/14 238 lb (108 kg)   BP Readings from Last 3 Encounters:  05/12/18 136/80  03/26/16 111/72  04/20/15 112/75   General  appearance: alert, no distress, WD/WN, HEENT: normocephalic, sclerae anicteric, PERRLA, EOMi, nares with turbinated edema, clear discharge, worse left, pharynx normal Oral cavity: MMM, no lesions Neck: supple, no lymphadenopathy, no thyromegaly, no masses, no bruits Heart: RRR, normal S1, S2, no murmurs Lungs: CTA bilaterally, no wheezes, rhonchi, or rales Extremities: no edema, no cyanosis, no clubbing Pulses: 2+ symmetric, upper and lower extremities, normal cap refill Neurological: alert, oriented x 3, CN2-12 intact, strength normal upper extremities and lower extremities, sensation normal throughout, DTRs 2+ throughout, no cerebellar signs, gait normal Psychiatric: normal affect, behavior normal, pleasant      Assessment: Encounter Diagnoses  Name Primary?  . Allergic rhinitis due to pollen, unspecified seasonality Yes  . Chronic sinusitis, unspecified location   . Vertigo       Plan: vertigo improving.  discussed PT for epley maneuvers the next time he gets vertigo.  In general hydrate well, avoid sudden movements over the next few days  He has chronic allergy issues.  He has failed Zyrtec, Xyzal, Allegra, Astepro, Flonase, Nasonex.    Begin trial of medications below.  I counseled on our role as primary care provider, discussed proper  use of emergency department, when to call us vs seeing a specialist or going to the emergency department.    [name] was seen today for np.  Diagnoses and all orders for this visit:  Allergic rhinitis due to pollen, unspecified seasonality  Chronic sinusitis, unspecified location  Vertigo  Other orders -     montelukast (SINGULAIR) 10 MG tablet; Take 1 tablet (10 mg total) by mouth at bedtime. -     Beclomethasone Dipropionate 80 MCG/ACT AERS; Place 1 spray into the nose 2 (two) times daily.

## 2018-09-12 ENCOUNTER — Emergency Department (HOSPITAL_COMMUNITY): Payer: Self-pay

## 2018-09-12 ENCOUNTER — Emergency Department (HOSPITAL_COMMUNITY)
Admission: EM | Admit: 2018-09-12 | Discharge: 2018-09-12 | Disposition: A | Discharge status: Home / Self Care | Payer: Self-pay | Attending: Emergency Medicine | Admitting: Emergency Medicine

## 2018-09-12 ENCOUNTER — Other Ambulatory Visit: Payer: Self-pay

## 2018-09-12 DIAGNOSIS — G44209 Tension-type headache, unspecified, not intractable: Secondary | ICD-10-CM

## 2018-09-12 DIAGNOSIS — Z79899 Other long term (current) drug therapy: Secondary | ICD-10-CM | POA: Insufficient documentation

## 2018-09-12 DIAGNOSIS — R51 Headache: Secondary | ICD-10-CM | POA: Insufficient documentation

## 2018-09-12 DIAGNOSIS — R112 Nausea with vomiting, unspecified: Secondary | ICD-10-CM | POA: Insufficient documentation

## 2018-09-12 DIAGNOSIS — J189 Pneumonia, unspecified organism: Secondary | ICD-10-CM

## 2018-09-12 DIAGNOSIS — J181 Lobar pneumonia, unspecified organism: Secondary | ICD-10-CM | POA: Insufficient documentation

## 2018-09-12 LAB — CBC
HEMATOCRIT: 47.6 % (ref 39.0–52.0)
Hemoglobin: 14.5 g/dL (ref 13.0–17.0)
MCH: 26.1 pg (ref 26.0–34.0)
MCHC: 30.5 g/dL (ref 30.0–36.0)
MCV: 85.8 fL (ref 80.0–100.0)
Platelets: 330 10*3/uL (ref 150–400)
RBC: 5.55 MIL/uL (ref 4.22–5.81)
RDW: 13.2 % (ref 11.5–15.5)
WBC: 18.6 10*3/uL — AB (ref 4.0–10.5)
nRBC: 0 % (ref 0.0–0.2)

## 2018-09-12 LAB — URINALYSIS, ROUTINE W REFLEX MICROSCOPIC
Bilirubin Urine: NEGATIVE
Glucose, UA: NEGATIVE mg/dL
HGB URINE DIPSTICK: NEGATIVE
KETONES UR: NEGATIVE mg/dL
Leukocytes, UA: NEGATIVE
NITRITE: NEGATIVE
PH: 6 (ref 5.0–8.0)
Protein, ur: NEGATIVE mg/dL
Specific Gravity, Urine: 1.027 (ref 1.005–1.030)

## 2018-09-12 LAB — COMPREHENSIVE METABOLIC PANEL
ALT: 17 U/L (ref 0–44)
AST: 18 U/L (ref 15–41)
Albumin: 4 g/dL (ref 3.5–5.0)
Alkaline Phosphatase: 19 U/L — ABNORMAL LOW (ref 38–126)
Anion gap: 12 (ref 5–15)
BUN: 12 mg/dL (ref 6–20)
CHLORIDE: 100 mmol/L (ref 98–111)
CO2: 26 mmol/L (ref 22–32)
CREATININE: 1.26 mg/dL — AB (ref 0.61–1.24)
Calcium: 9.3 mg/dL (ref 8.9–10.3)
GFR calc Af Amer: >60 mL/min (ref >60)
Glucose, Bld: 113 mg/dL — ABNORMAL HIGH (ref 70–99)
Potassium: 4.4 mmol/L (ref 3.5–5.1)
Sodium: 138 mmol/L (ref 135–145)
Total Bilirubin: 1 mg/dL (ref 0.3–1.2)
Total Protein: 7.4 g/dL (ref 6.5–8.1)

## 2018-09-12 LAB — INFLUENZA PANEL BY PCR (TYPE A & B)
Influenza A By PCR: NEGATIVE
Influenza B By PCR: NEGATIVE

## 2018-09-12 LAB — LIPASE, BLOOD: LIPASE: 31 U/L (ref 11–51)

## 2018-09-12 MED ORDER — AZITHROMYCIN 250 MG PO TABS: 250.0000 mg | ORAL_TABLET | Freq: Every day | ORAL | 0 refills | Status: AC

## 2018-09-12 MED ORDER — AMOXICILLIN 500 MG PO CAPS: 1000.0000 mg | ORAL_CAPSULE | Freq: Three times a day (TID) | ORAL | 0 refills | Status: AC

## 2018-09-12 MED ORDER — KETOROLAC TROMETHAMINE 15 MG/ML IJ SOLN
15.0000 mg | Freq: Once | INTRAMUSCULAR | Status: AC
Start: 2018-09-12 — End: 2018-09-12
  Administered 2018-09-12: 15 mg via INTRAVENOUS
  Filled 2018-09-12: qty 1

## 2018-09-12 MED ORDER — SODIUM CHLORIDE 0.9 % IV BOLUS
1000.0000 mL | Freq: Once | INTRAVENOUS | Status: AC
  Administered 2018-09-12: 1000 mL via INTRAVENOUS
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
## 2018-09-12 NOTE — ED Triage Notes (Signed)
Pt arrives to ED for c/o of vomiting and headache. Pt states has abd pain with emesis.

## 2018-09-12 NOTE — Discharge Instructions (Addendum)
Mr. Anthony Durham, who came into the emergency room due to chills, vomiting and headache since last night. Your CXR showed you have pneumonia. I prescribe you antibiotic to be taken as instructed. As we talked, If your symptoms worsened or did not improve in nest 3-4 days, or if continue having vomiting, or developing new symptoms, please come back to Emergency room.   Thank you

## 2019-03-08 IMAGING — DX DG CHEST 2V
2 series · 2 of 2 positions shown · non-contrast
Comparison: Chest x-ray 03/26/2016

CLINICAL DATA: Vomiting and chills.

EXAM:
CHEST - 2 VIEW

[chest pa]
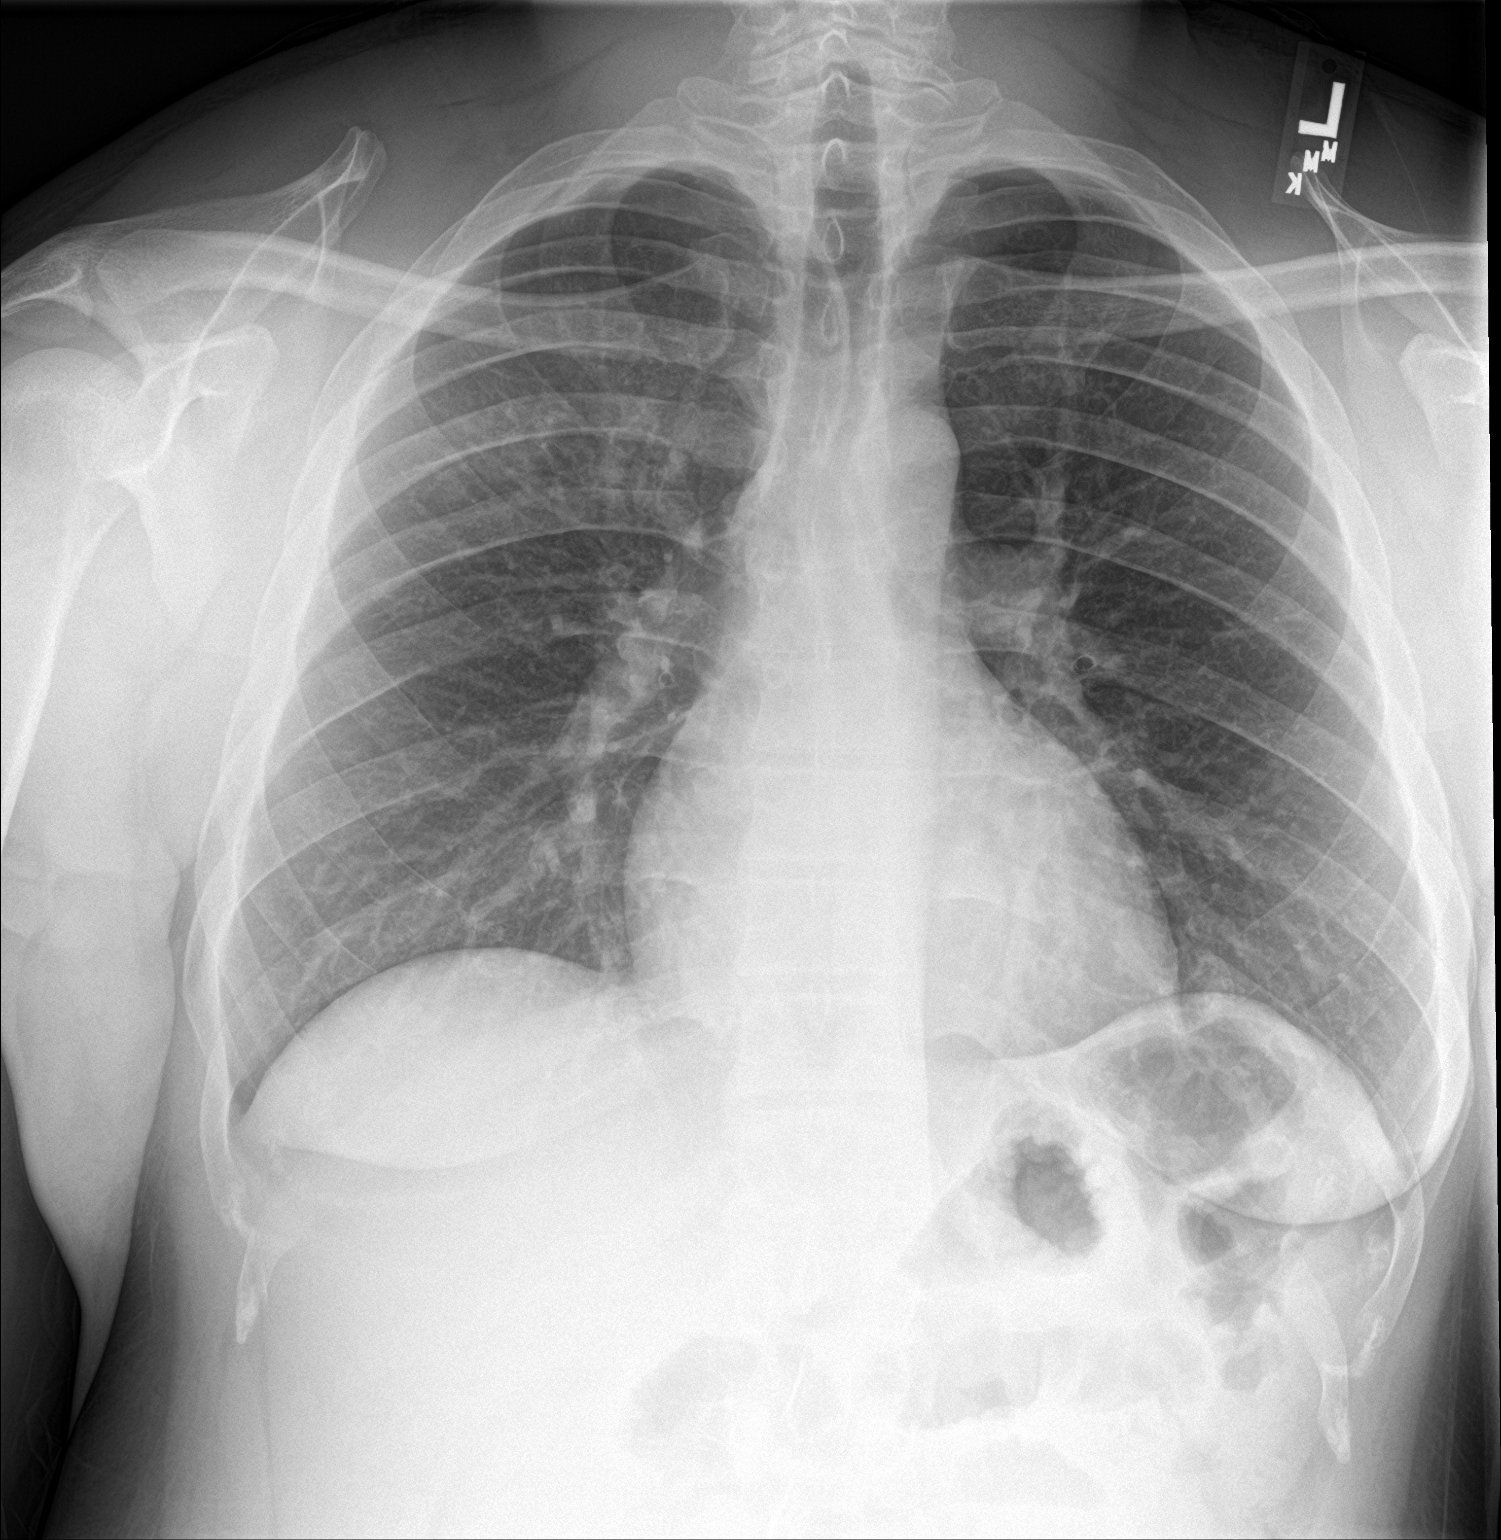

[chest lat]
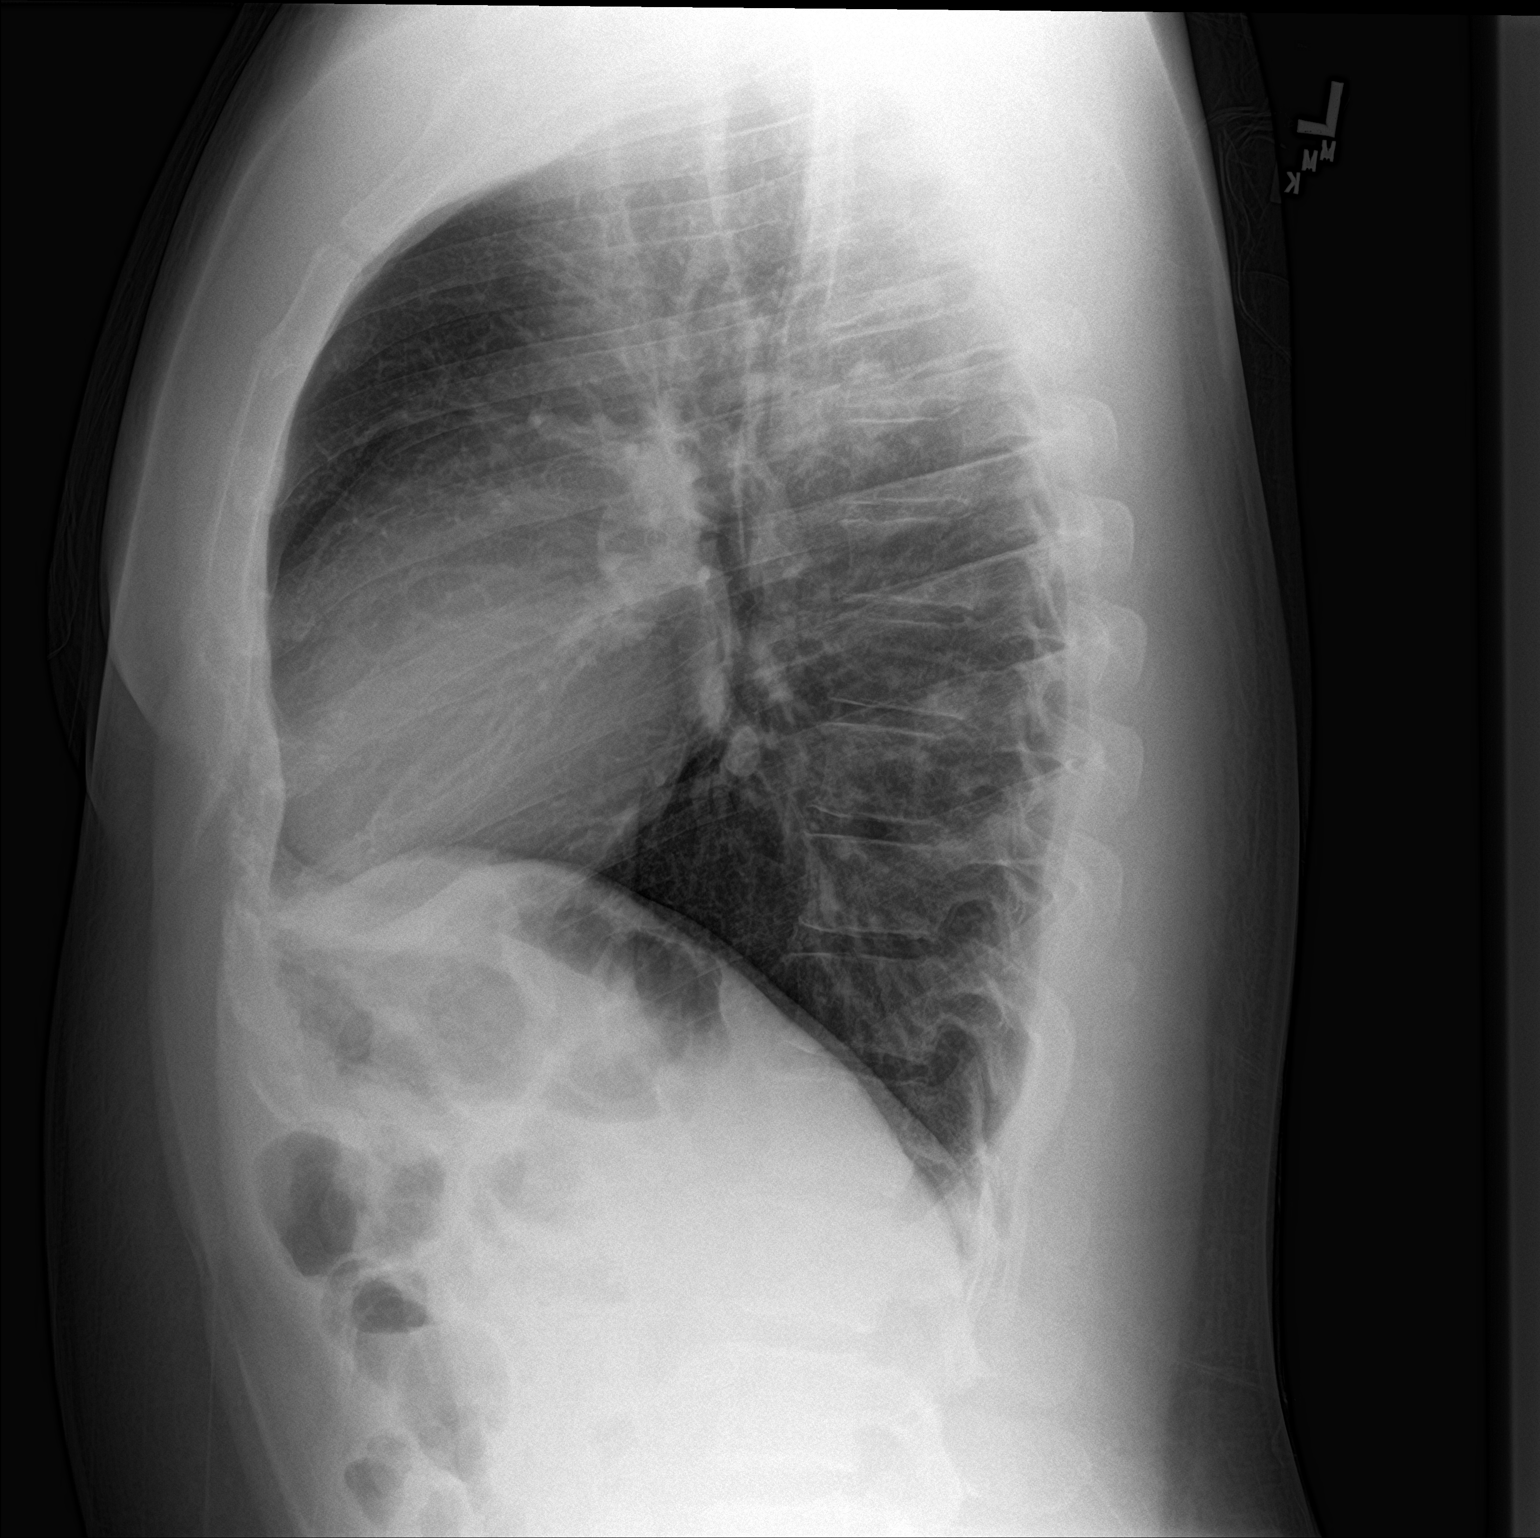

[2 of 2 positions shown; findings below may reference images not displayed]

FINDINGS: The cardiac silhouette, mediastinal and hilar contours are within
normal limits and stable.

Patchy density in the right upper lobe suspicious for pneumonia. No
other infiltrates and no edema or effusions. The bony thorax is
intact.
IMPRESSION: Right upper lobe pneumonia.

## 2022-06-10 ENCOUNTER — Encounter: Payer: Self-pay | Admitting: Internal Medicine

## 2022-07-14 ENCOUNTER — Encounter: Payer: Self-pay | Admitting: Internal Medicine

## 2023-03-30 ENCOUNTER — Emergency Department (HOSPITAL_COMMUNITY)
Admission: EM | Admit: 2023-03-30 | Discharge: 2023-03-30 | Disposition: A | Discharge status: Home / Self Care | Payer: Commercial Managed Care - PPO | Attending: Emergency Medicine | Admitting: Emergency Medicine

## 2023-03-30 ENCOUNTER — Emergency Department (HOSPITAL_COMMUNITY): Payer: Commercial Managed Care - PPO

## 2023-03-30 ENCOUNTER — Other Ambulatory Visit: Payer: Self-pay

## 2023-03-30 DIAGNOSIS — T7840XA Allergy, unspecified, initial encounter: Secondary | ICD-10-CM | POA: Diagnosis present

## 2023-03-30 DIAGNOSIS — R519 Headache, unspecified: Secondary | ICD-10-CM | POA: Diagnosis not present

## 2023-03-30 DIAGNOSIS — T63461A Toxic effect of venom of wasps, accidental (unintentional), initial encounter: Secondary | ICD-10-CM | POA: Insufficient documentation

## 2023-03-30 DIAGNOSIS — R55 Syncope and collapse: Secondary | ICD-10-CM | POA: Diagnosis not present

## 2023-03-30 LAB — COMPREHENSIVE METABOLIC PANEL
ALT: 20 U/L (ref 0–44)
AST: 17 U/L (ref 15–41)
Albumin: 3.8 g/dL (ref 3.5–5.0)
Alkaline Phosphatase: 22 U/L — ABNORMAL LOW (ref 38–126)
Anion gap: 7 (ref 5–15)
BUN: 13 mg/dL (ref 6–20)
CO2: 24 mmol/L (ref 22–32)
Calcium: 8.6 mg/dL — ABNORMAL LOW (ref 8.9–10.3)
Chloride: 106 mmol/L (ref 98–111)
Creatinine, Ser: 1.12 mg/dL (ref 0.61–1.24)
GFR, Estimated: >60 mL/min (ref >60)
Glucose, Bld: 149 mg/dL — ABNORMAL HIGH (ref 70–99)
Potassium: 3.8 mmol/L (ref 3.5–5.1)
Sodium: 137 mmol/L (ref 135–145)
Total Bilirubin: 0.8 mg/dL (ref 0.3–1.2)
Total Protein: 6.9 g/dL (ref 6.5–8.1)

## 2023-03-30 LAB — URINALYSIS, ROUTINE W REFLEX MICROSCOPIC
Bacteria, UA: NONE SEEN
Bilirubin Urine: NEGATIVE
Glucose, UA: NEGATIVE mg/dL
Hgb urine dipstick: NEGATIVE
Ketones, ur: NEGATIVE mg/dL
Leukocytes,Ua: NEGATIVE
Nitrite: NEGATIVE
Protein, ur: 30 mg/dL — AB
Specific Gravity, Urine: 1.015 (ref 1.005–1.030)
pH: 6 (ref 5.0–8.0)

## 2023-03-30 LAB — CBC WITH DIFFERENTIAL/PLATELET
Abs Immature Granulocytes: 0.06 10*3/uL (ref 0.00–0.07)
Basophils Absolute: 0 10*3/uL (ref 0.0–0.1)
Basophils Relative: 0 %
Eosinophils Absolute: 0 10*3/uL (ref 0.0–0.5)
Eosinophils Relative: 0 %
HCT: 42.8 % (ref 39.0–52.0)
Hemoglobin: 13.3 g/dL (ref 13.0–17.0)
Immature Granulocytes: 1 %
Lymphocytes Relative: 9 %
Lymphs Abs: 1.1 10*3/uL (ref 0.7–4.0)
MCH: 26.8 pg (ref 26.0–34.0)
MCHC: 31.1 g/dL (ref 30.0–36.0)
MCV: 86.3 fL (ref 80.0–100.0)
Monocytes Absolute: 1 10*3/uL (ref 0.1–1.0)
Monocytes Relative: 9 %
Neutro Abs: 9.8 10*3/uL — ABNORMAL HIGH (ref 1.7–7.7)
Neutrophils Relative %: 81 %
Platelets: 247 10*3/uL (ref 150–400)
RBC: 4.96 MIL/uL (ref 4.22–5.81)
RDW: 12.6 % (ref 11.5–15.5)
WBC: 11.9 10*3/uL — ABNORMAL HIGH (ref 4.0–10.5)
nRBC: 0 % (ref 0.0–0.2)

## 2023-03-30 LAB — TROPONIN I (HIGH SENSITIVITY)
Troponin I (High Sensitivity): 41 ng/L — ABNORMAL HIGH (ref <18)
Troponin I (High Sensitivity): 62 ng/L — ABNORMAL HIGH (ref <18)
Troponin I (High Sensitivity): 65 ng/L — ABNORMAL HIGH (ref <18)

## 2023-03-30 MED ORDER — METHYLPREDNISOLONE SODIUM SUCC 125 MG IJ SOLR
125.0000 mg | Freq: Once | INTRAMUSCULAR | Status: AC
  Administered 2023-03-30: 125 mg via INTRAVENOUS
  Filled 2023-03-30: qty 2

## 2023-03-30 MED ORDER — PREDNISONE 20 MG PO TABS: 40.0000 mg | ORAL_TABLET | Freq: Every day | ORAL | 0 refills | Status: AC

## 2023-03-30 MED ORDER — EPINEPHRINE 0.3 MG/0.3ML IJ SOAJ: 0.3000 mg | INTRAMUSCULAR | 0 refills | Status: AC | PRN

## 2023-03-30 MED ORDER — FAMOTIDINE IN NACL 20-0.9 MG/50ML-% IV SOLN
20.0000 mg | Freq: Once | INTRAVENOUS | Status: AC
  Administered 2023-03-30: 20 mg via INTRAVENOUS
  Filled 2023-03-30: qty 50

## 2023-03-30 MED ORDER — FAMOTIDINE 20 MG PO TABS: 20.0000 mg | ORAL_TABLET | Freq: Two times a day (BID) | ORAL | 0 refills | Status: AC

## 2023-03-30 MED ORDER — LACTATED RINGERS IV BOLUS
1000.0000 mL | Freq: Once | INTRAVENOUS | Status: AC
  Administered 2023-03-30: 1000 mL via INTRAVENOUS

## 2023-03-30 NOTE — Discharge Instructions (Addendum)
You were seen in the ER today for evaluation after your wasp being a syncopal event.  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 I would like for you to follow up with cardiology as soon as possible for evaluation of this. I am sending you home with a few medications.  1 is prednisone that she will take once daily for the next 5 days.  The other medication called Pepcid to take twice a day for the next 5 days.  I am also going home with an EpiPen as well.  If you get stung by a wasp or bee or any other insect with this finger again, and start to feel symptoms, please use his EpiPen.  Please make sure you are staying well-hydrated drinking plenty of fluids, any water.  If you have any concerns, new or worsening symptoms, please return to the nearest emergency department for evaluation.  Contact a doctor if: You have episodes of near fainting. Get help right away if: You pass out or faint. You hit your head or are injured after fainting. You have any of these symptoms: Fast or uneven heartbeats (palpitations). Pain in your chest, belly, or back. Shortness of breath. You have jerky movements that you cannot control (seizure). You have a very bad headache. You are confused. You have problems with how you see (vision). You are very weak. You have trouble walking. You are bleeding from your mouth or your butt (rectum). You have black or tarry poop (stool). These symptoms may be an emergency. Get help right away. Call your local emergency services (911 in the [place] Do not wait to see if the symptoms will go away. Do not drive yourself to the hospital.

## 2023-03-30 NOTE — ED Provider Notes (Signed)
[place] EMERGENCY DEPARTMENT AT [facility] Provider Note   CSN: [id number] Arrival date & time: 03/30/23  1137     History Chief Complaint  Patient presents with   Allergic Reaction   Loss of Consciousness   Headache    Anthony Durham is a 37 y.o. male reportedly otherwise healthy presents emergency room today for evaluation of allergic reaction and syncopal episode.  Earlier today, patient works as a Conservator, museum/gallery.  He was walking around when he got stung by what he thinks was a wasp on his left wrist and right leg.  He reports he started to feel lightheaded afterwards and started seeing some black dots and felt he was in a pass out.  He sat in his truck and reports that he woke up sitting in his truck.  He did not have any chest pain or shortness breath for after this.  No trouble swallowing or drooling on himself.  He denies any trouble breathing.  He had an episode of nausea vomiting while in the EMS truck however does not have any now.  EMS reports that he urinated on himself in the truck as well.  He is complaining of some headache.  He reports that he is not anaphylactic to anything else.  No known drug allergies.   Allergic Reaction Presenting symptoms: rash   Presenting symptoms: no difficulty swallowing   Loss of Consciousness Associated symptoms: headaches, nausea and vomiting   Associated symptoms: no chest pain, no fever and no shortness of breath   Headache Associated symptoms: nausea, syncope and vomiting   Associated symptoms: no abdominal pain, no congestion and no fever        Home Medications Prior to Admission medications   Medication Sig Start Date End Date Taking? Authorizing Provider  EPINEPHrine 0.3 mg/0.3 mL IJ SOAJ injection Inject 0.3 mg into the muscle as needed for anaphylaxis. 03/30/23  Yes Achille Rich, PA-C  famotidine (PEPCID) 20 MG tablet Take 1 tablet (20 mg total) by mouth 2 (two) times daily. 03/30/23  Yes Achille Rich, PA-C   predniSONE (DELTASONE) 20 MG tablet Take 2 tablets (40 mg total) by mouth daily. 03/30/23  Yes Achille Rich, PA-C  amoxicillin (AMOXIL) 500 MG capsule Take 2 capsules (1,000 mg total) by mouth 3 (three) times daily. 09/12/18   Masoudi, Elhamalsadat, MD  azithromycin (ZITHROMAX) 250 MG tablet Take 1 tablet (250 mg total) by mouth daily. Take first 2 tablets together, then 1 every day until finished. 09/12/18   Masoudi, Shawna Orleans, MD  Beclomethasone Dipropionate 80 MCG/ACT AERS Place 1 spray into the nose 2 (two) times daily. Patient not taking: Reported on 09/12/2018 05/12/18   Tysinger, Kermit Balo, PA-C  montelukast (SINGULAIR) 10 MG tablet Take 1 tablet (10 mg total) by mouth at bedtime. Patient not taking: Reported on 09/12/2018 05/12/18   Tysinger, Kermit Balo, PA-C  Multiple Vitamin (MULTIVITAMIN WITH MINERALS) TABS tablet Take 1 tablet by mouth daily.    [provider]      Allergies    Patient has no known allergies.    Review of Systems   Review of Systems  Constitutional:  Negative for chills and fever.  HENT:  Negative for congestion, rhinorrhea and trouble swallowing.   Respiratory:  Negative for shortness of breath.   Cardiovascular:  Positive for syncope. Negative for chest pain.  Gastrointestinal:  Positive for nausea and vomiting. Negative for abdominal pain.  Skin:  Positive for rash.  Neurological:  Positive for syncope and headaches.  Physical Exam Updated Vital Signs BP 116/63   Pulse 79   Temp 99.1 F (37.3 C) (Oral)   Resp 18   Ht 5\' 10"  (1.778 m)   Wt 113.4 kg   SpO2 99%   BMI 35.87 kg/m  Physical Exam Vitals and nursing note reviewed.  Constitutional:      General: He is not in acute distress.    Appearance: He is not toxic-appearing.  HENT:     Head: Normocephalic and atraumatic.     Mouth/Throat:     Mouth: Mucous membranes are moist.  Eyes:     Extraocular Movements: Extraocular movements intact.     Pupils: Pupils are equal, round, and  reactive to light.  Cardiovascular:     Rate and Rhythm: Normal rate.  Pulmonary:     Effort: Pulmonary effort is normal. No respiratory distress.     Comments: I do not appreciate any stridor, wheezes, or rhonchi.  Lungs are clear to auscultation bilaterally.  Satting 100% on room air without increased work of breathing. Abdominal:     Palpations: Abdomen is soft.     Tenderness: There is no abdominal tenderness. There is no guarding.  Musculoskeletal:     Cervical back: Normal range of motion. No rigidity.  Skin:    General: Skin is warm and dry.  Neurological:     Mental Status: He is alert.     GCS: GCS eye subscore is 4. GCS verbal subscore is 5. GCS motor subscore is 6.     Cranial Nerves: Cranial nerves 2-12 are intact. No cranial nerve deficit, dysarthria or facial asymmetry.     Motor: No pronator drift.     Comments: GCS 15.  Cranial nerves II through XII intact.  No facial droop noted.  He is answering questions appropriate with appropriate speech.  Patient reports sensation intact bilaterally and is symmetric.  No pronator drift.  Strength is intact in patient's bilateral upper and lower extremities.  Visual fields intact.     ED Results / Procedures / Treatments   Labs (all labs ordered are listed, but only abnormal results are displayed) Labs Reviewed  CBC WITH DIFFERENTIAL/PLATELET - Abnormal; Notable for the following components:      Result Value   WBC 11.9 (*)    Neutro Abs 9.8 (*)    All other components within normal limits  COMPREHENSIVE METABOLIC PANEL - Abnormal; Notable for the following components:   Glucose, Bld 149 (*)    Calcium 8.6 (*)    Alkaline Phosphatase 22 (*)    All other components within normal limits  URINALYSIS, ROUTINE W REFLEX MICROSCOPIC - Abnormal; Notable for the following components:   Protein, ur 30 (*)    All other components within normal limits  TROPONIN I (HIGH SENSITIVITY) - Abnormal; Notable for the following components:    Troponin I (High Sensitivity) 41 (*)    All other components within normal limits  TROPONIN I (HIGH SENSITIVITY) - Abnormal; Notable for the following components:   Troponin I (High Sensitivity) 62 (*)    All other components within normal limits  TROPONIN I (HIGH SENSITIVITY) - Abnormal; Notable for the following components:   Troponin I (High Sensitivity) 65 (*)    All other components within normal limits    EKG None  Radiology CT Head Wo Contrast  Result Date: 03/30/2023 CLINICAL DATA:  Head trauma, moderate-severe EXAM: CT HEAD WITHOUT CONTRAST TECHNIQUE: Contiguous axial images were obtained from the base of the skull  through the vertex without intravenous contrast. RADIATION DOSE REDUCTION: This exam was performed according to the departmental dose-optimization program which includes automated exposure control, adjustment of the mA and/or kV according to patient size and/or use of iterative reconstruction technique. COMPARISON:  None Available. FINDINGS: Brain: No evidence of acute infarction, hemorrhage, hydrocephalus, extra-axial collection or mass lesion/mass effect. Vascular: No hyperdense vessel. Skull: No acute fracture. Sinuses/Orbits: Clear sinuses.  No acute orbital findings. Other: No mastoid effusions. IMPRESSION: No evidence of acute intracranial abnormality. Electronically Signed   By: Feliberto Harts M.D.   On: 03/30/2023 14:15    Procedures Procedures   Medications Ordered in ED Medications  famotidine (PEPCID) IVPB 20 mg premix (0 mg Intravenous Stopped 03/30/23 1336)  lactated ringers bolus 1,000 mL (0 mLs Intravenous Stopped 03/30/23 1336)  methylPREDNISolone sodium succinate (SOLU-MEDROL) 125 mg/2 mL injection 125 mg (125 mg Intravenous Given 03/30/23 1328)    ED Course/ Medical Decision Making/ A&P                           Medical Decision Making Amount and/or Complexity of Data Reviewed Labs: ordered. Radiology: ordered.  Risk Prescription drug  management.   37 y.o. male presents to the ER for evaluation of allergic reaction with syncope. Differential diagnosis includes but is not limited to CVA, ACS, arrhythmia, vasovagal syncope, orthostatic hypotension, sepsis, hypoglycemia, electrolyte disturbance, respiratory failure, symptomatic anemia, dehydration, heat injury, polypharmacy, malignancy, anxiety/panic attack, anaphylaxis, allergic reaction. Vital signs unremarkable.  From looking at patient's previous blood pressures they are all within this range as well. Physical exam as noted above.   Patient received p.o. Benadryl with EMS.  He was given Pepcid and Solu-Medrol while here as well as a liter of IV fluids.  I do not think patient needs any epinephrine as he is not hypotensive nor is he having any trouble breathing, chest pain, trouble swallowing, nausea, vomiting, or itching. Discussed with my attending who agrees that epinephrine does not need to be given since the patient is not hypotensive.   I do not appreciate any focal deficits.  Patient has some localized erythema to the left wrist and right lower leg.  I do not appreciate any skin swelling, erythema, or hives to the rest of the body.  He denies any chest pain or shortness of breath or trouble swallowing.  He denies any nausea but did have a vomiting episode with EMS.  EMS already gave him 50 mg of Benadryl.  Will order him some Pepcid to go along with this as well as some Solu-Medrol.  I do not think this patient needs any epinephrine at this time given that he was never hypotensive with EMS or with [place] here in the ER.  CT of the head is unremarkable per radiology reading.  I independently reviewed and interpreted the patient's labs.  CBC shows slightly elevated white blood cell count 11.9 with a left shift.  Could be acute phase reaction from the allergy.  Troponin initially at 41 and is now at 62 now at 65.  CMP shows elevated glucose at 149, mildly decreased calcium 8.6.  Mildly  decreased alk phos at 22, otherwise no electrolyte or LFT abnormality.  Urinalysis shows some protein otherwise unremarkable.   Unfortunately, EKGs are not uploading with epic however my attending reviewed and evaluated the EKG and read as normal sinus rhythm.  Because of patient's unremarkable EKG and slightly elevated troponins, I did consult cardiology.  I  spoke with Dr. Izora Ribas.  I discussed with him the patient's symptoms after wasp sting as well as the elevated troponins.  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  He reports that this usually has some elevated troponins and syncopal episodes after sting from an insect.  He reports the patient does not have any chest pain or shortness of breath and the troponin is less than 75 that the patient can be discharged home with cardiology follow-up.  He reported to treat this as a insect sting and there is no specific cardiology intervention or medication and need to give.  Patient was reevaluated multiple times and still denies any chest pain or shortness of breath.  He reports he did have a headache when coming here however his headache is now resolved after the fluids.  I discussed with the patient and partner at bedside the conversation I had with cardiology about the this syndrome. They feel ok with being discharged home.  Vital signs are still stable.  Overall, the patient appears stable and feels okay with going home.  I do see with cardiology have a more reassuring picture.  I have placed a ambulatory referral for cardiology as well.  Again, he is still not having any chest pain or shortness of breath and is asymptomatic here.  Will prescribe him some prednisone, Pepcid, and EpiPen.  I think he can be discharged home with close cardiology follow-up.  We discussed the results of the labs/imaging. The plan is follow-up with cardiology, take medication as prescribed, use EpiPen as needed. We discussed strict return  precautions and red flag symptoms. The patient verbalized their understanding and agrees to the plan. The patient is stable and being discharged home in good condition.  Portions of this report may have been transcribed using voice recognition software. Every effort was made to ensure accuracy; however, inadvertent computerized transcription errors may be present.   I discussed this case with my attending physician who cosigned this note including patient's presenting symptoms, physical exam, and planned diagnostics and interventions. Attending physician stated agreement with plan or made changes to plan which were implemented.   Final Clinical Impression(s) / ED Diagnoses Final diagnoses:  Syncope and collapse  Wasp sting, accidental or unintentional, initial encounter  Allergic reaction, initial encounter    Rx / DC Orders ED Discharge Orders          Ordered    Ambulatory referral to Cardiology        03/30/23 1853    predniSONE (DELTASONE) 20 MG tablet  Daily        03/30/23 1853    famotidine (PEPCID) 20 MG tablet  2 times daily        03/30/23 1853    EPINEPHrine 0.3 mg/0.3 mL IJ SOAJ injection  As needed        03/30/23 1854              Achille Rich, PA-C 03/30/23 1855    Loetta Rough, MD 03/31/23 [phone]

## 2023-03-30 NOTE — ED Triage Notes (Signed)
Arrived by EMS due to wasp stings to wrist and ankle. Per EMS pt passed out in his truck, urinated on himself; c/o headache with episode of n/v. EMS provided 50mg  po benadryl pta.

## 2023-04-04 NOTE — Progress Notes (Signed)
Cardiology Office Note:    Date:  04/05/2023   ID:  Carlynn Spry, DOB 1986-03-24, MRN [id number]  PCP:  Jac Canavan, PA-C  Cardiologist:  None  Electrophysiologist:  None   Referring MD: Achille Rich, PA-C   Chief Complaint  Patient presents with   Loss of Consciousness    History of Present Illness:    Anthony Durham is a 37 y.o. male with no significant past medical history who presents as an ED follow-up for syncope.  He was seen in the ED on 03/30/2023.  He had syncopal episode after being stung by [employer].  Noted to have mildly elevated troponins (41 > 62 > 65).  He reports he was at work and stepped on [employer] and was stung in his hand and leg.  States that about 5 minutes later he started to feel lightheaded.  States that he became diaphoretic and started to see spots.  He sat in his truck and then passed out.  When he woke up he was sweating profusely and had urinated on himself.  He denies any chest pain or dyspnea.  Reports had similar syncopal episode 3 years ago after getting stung by yellow jacket.  Reports he is very active, walks about 15,000 steps per day at work, denies any exertional symptoms.  Denies any lower extremity edema.  No palpitations.  No smoking history.  No family history of heart disease.    Past Medical History:  Diagnosis Date   Allergy    year round   Sinus problem    3-4 sinusitis per year, self reported 05/2018   Tear of PCL (posterior cruciate ligament) of knee     No past surgical history on file.  Current Medications: Current Meds  Medication Sig   montelukast (SINGULAIR) 10 MG tablet Take 1 tablet (10 mg total) by mouth at bedtime.   Multiple Vitamin (MULTIVITAMIN WITH MINERALS) TABS tablet Take 1 tablet by mouth daily.     Allergies:   Bee venom   Social History   Socioeconomic History   Marital status: Married    Spouse name: Not on file   Number of children: Not on file   Years of education: Not on file    Highest education level: Not on file  Occupational History   Not on file  Tobacco Use   Smoking status: Never   Smokeless tobacco: Never  Substance and Sexual Activity   Alcohol use: No   Drug use: No   Sexual activity: Yes  Other Topics Concern   Not on file  Social History Narrative   Not on file   Social Determinants of Health   Financial Resource Strain: Not on file  Food Insecurity: Not on file  Transportation Needs: Not on file  Physical Activity: Not on file  Stress: Not on file  Social Connections: Not on file     Family History: No family history of heart disease.  ROS:   Please see the history of present illness.     All other systems reviewed and are negative.  EKGs/Labs/Other Studies Reviewed:    The following studies were reviewed today:   EKG:   04/05/2023: Normal sinus rhythm, rate 79, nonspecific T wave flattening  Recent Labs: 03/30/2023: ALT 20; BUN 13; Creatinine, Ser 1.12; Hemoglobin 13.3; Platelets 247; Potassium 3.8; Sodium 137  Recent Lipid Panel No results found for: "CHOL", "TRIG", "HDL", "CHOLHDL", "VLDL", "LDLCALC", "LDLDIRECT"  Physical Exam:    VS:  BP 132/86  Pulse 79   Ht 5\' 10"  (1.778 m)   Wt 259 lb (117.5 kg)   SpO2 95%   BMI 37.16 kg/m     Wt Readings from Last 3 Encounters:  04/05/23 259 lb (117.5 kg)  03/30/23 250 lb (113.4 kg)  05/12/18 257 lb (116.6 kg)     GEN:  Well nourished, well developed in no acute distress HEENT: Normal NECK: No JVD; No carotid bruits LYMPHATICS: No lymphadenopathy CARDIAC: RRR, no murmurs, rubs, gallops RESPIRATORY:  Clear to auscultation without rales, wheezing or rhonchi  ABDOMEN: Soft, non-tender, non-distended MUSCULOSKELETAL:  No edema; No deformity  SKIN: Warm and dry NEUROLOGIC:  Alert and oriented x 3 PSYCHIATRIC:  Normal affect   ASSESSMENT:    1. Syncope and collapse   2. Troponin level elevated    PLAN:    Syncope: Description suggests vasovagal syncope likely  triggered by yellowjacket sting.  Discussed importance of lying on ground until symptoms pass if having prodromal symptoms.  Check echocardiogram to rule out structural heart disease.  Check Zio patch x 2 weeks to rule out arrhythmia.  Did discuss [facility] regulations that need to be free of syncope x 6 months to drive.  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  Concerns regarding medicines are outlined above.  Orders Placed This Encounter  Procedures   LONG TERM MONITOR (3-14 DAYS)   EKG 12-Lead   ECHOCARDIOGRAM COMPLETE   No orders of the defined types were placed in this encounter.   Patient Instructions  Medication Instructions:  Continue same medications   Lab Work: None ordered   Testing/Procedures: Echo  14 day Heart Monitor ( ZIO )    Follow-Up: At [facility], you and your health needs are our priority.  As part of our continuing mission to provide you with exceptional heart care, we have created designated Provider Care Teams.  These Care Teams include your primary Cardiologist (physician) and Advanced Practice Providers (APPs -  Physician Assistants and Nurse Practitioners) who all work together to provide you with the care you need, when you need it.  We recommend signing up for the patient portal called "MyChart".  Sign up information is provided on this After Visit Summary.  MyChart is used to connect with patients for Virtual Visits (Telemedicine).  Patients are able to view lab/test results, encounter notes, upcoming appointments, etc.  Non-urgent messages can be sent to your provider as well.   To learn more about what you can do with MyChart, go to [email or web address].    Your next appointment:  6 months     Provider:  Dr.Darleny Sem     Signed, Little Ishikawa, MD  04/05/2023 2:22 PM    [facility]

## 2023-04-05 ENCOUNTER — Encounter: Payer: Self-pay | Admitting: Cardiology

## 2023-04-05 ENCOUNTER — Ambulatory Visit: Payer: Commercial Managed Care - PPO | Attending: Internal Medicine | Admitting: Cardiology

## 2023-04-05 ENCOUNTER — Ambulatory Visit: Payer: Commercial Managed Care - PPO | Attending: Cardiology

## 2023-04-05 VITALS — BP 132/86 | HR 79 | Ht 70.0 in | Wt 259.0 lb

## 2023-04-05 DIAGNOSIS — R55 Syncope and collapse: Secondary | ICD-10-CM

## 2023-04-05 DIAGNOSIS — R7989 Other specified abnormal findings of blood chemistry: Secondary | ICD-10-CM | POA: Diagnosis not present

## 2023-04-05 NOTE — Patient Instructions (Signed)
Medication Instructions:  Continue same medications   Lab Work: None ordered   Testing/Procedures: Echo  14 day Heart Monitor ( ZIO )    Follow-Up: At [facility], you and your health needs are our priority.  As part of our continuing mission to provide you with exceptional heart care, we have created designated Provider Care Teams.  These Care Teams include your primary Cardiologist (physician) and Advanced Practice Providers (APPs -  Physician Assistants and Nurse Practitioners) who all work together to provide you with the care you need, when you need it.  We recommend signing up for the patient portal called "MyChart".  Sign up information is provided on this After Visit Summary.  MyChart is used to connect with patients for Virtual Visits (Telemedicine).  Patients are able to view lab/test results, encounter notes, upcoming appointments, etc.  Non-urgent messages can be sent to your provider as well.   To learn more about what you can do with MyChart, go to [email or web address].    Your next appointment:  6 months    Provider:  Dr.Schumann

## 2023-04-05 NOTE — Progress Notes (Unsigned)
Enrolled for [employer] to mail a ZIO XT long term holter monitor to the patients address on file.  

## 2023-04-12 DIAGNOSIS — R55 Syncope and collapse: Secondary | ICD-10-CM

## 2023-04-30 ENCOUNTER — Ambulatory Visit (HOSPITAL_COMMUNITY): Payer: Commercial Managed Care - PPO | Attending: Cardiovascular Disease

## 2023-04-30 DIAGNOSIS — R55 Syncope and collapse: Secondary | ICD-10-CM | POA: Insufficient documentation

## 2023-04-30 LAB — ECHOCARDIOGRAM COMPLETE
Area-P 1/2: 2.71 cm2
S' Lateral: 3.2 cm

## 2023-05-03 ENCOUNTER — Telehealth: Payer: Self-pay | Admitting: Cardiology

## 2023-05-03 NOTE — Telephone Encounter (Signed)
Patient's wife is returning call to discuss echo results. 

## 2023-05-03 NOTE — Telephone Encounter (Signed)
Called pt wife to discuss husbands results. It says call can not be completed as dialed.

## 2023-05-04 NOTE — Telephone Encounter (Signed)
Called pt and it states the call cannot be completed as dialed.

## 2023-05-05 NOTE — Telephone Encounter (Signed)
Called pt and it states the call cannot be completed as dialed.

## 2023-05-11 ENCOUNTER — Other Ambulatory Visit: Payer: Self-pay

## 2023-05-11 DIAGNOSIS — I77810 Thoracic aortic ectasia: Secondary | ICD-10-CM

## 2023-05-13 ENCOUNTER — Ambulatory Visit: Payer: Commercial Managed Care - PPO | Admitting: Internal Medicine

## 2023-10-01 ENCOUNTER — Ambulatory Visit: Payer: Commercial Managed Care - PPO | Attending: Cardiology | Admitting: Cardiology

## 2023-10-01 NOTE — Progress Notes (Deleted)
Cardiology Office Note:    Date:  10/01/2023   ID:  Anthony Durham, DOB 09/14/1986, MRN [id number]  PCP:  Jac Canavan, PA-C  Cardiologist:  None  Electrophysiologist:  None   Referring MD: Jac Canavan, PA-C   No chief complaint on file.   History of Present Illness:    Anthony Durham is a 37 y.o. male with no significant past medical history who presents for follow-up.  He was initially seen as an ED follow-up for syncope 04/2023.  He was seen in the ED on 03/30/2023.  He had syncopal episode after being stung by [employer].  Noted to have mildly elevated troponins (41 > 62 > 65).  He reports he was at work and stepped on [employer] and was stung in his hand and leg.  States that about 5 minutes later he started to feel lightheaded.  States that he became diaphoretic and started to see spots.  He sat in his truck and then passed out.  When he woke up he was sweating profusely and had urinated on himself.  He denies any chest pain or dyspnea.  Reports had similar syncopal episode 3 years ago after getting stung by yellow jacket.  Reports he is very active, walks about 15,000 steps per day at work, denies any exertional symptoms.  Denies any lower extremity edema.  No palpitations.  No smoking history.  No family history of heart disease.  Echocardiogram 04/30/2023 showed EF 60 to 65%, normal diastolic function, normal RV function, no significant valvular disease, mild dilatation of aortic root measuring 40 mm.  Zio patch x 14 days 04/2023 showed no significant arrhythmias.  Since last clinic visit,   Past Medical History:  Diagnosis Date   Allergy    year round   Sinus problem    3-4 sinusitis per year, self reported 05/2018   Tear of PCL (posterior cruciate ligament) of knee     No past surgical history on file.  Current Medications: No outpatient medications have been marked as taking for the 10/01/23 encounter (Appointment) with Little Ishikawa, MD.      Allergies:   Bee venom   Social History   Socioeconomic History   Marital status: Married    Spouse name: Not on file   Number of children: Not on file   Years of education: Not on file   Highest education level: Not on file  Occupational History   Not on file  Tobacco Use   Smoking status: Never   Smokeless tobacco: Never  Substance and Sexual Activity   Alcohol use: No   Drug use: No   Sexual activity: Yes  Other Topics Concern   Not on file  Social History Narrative   Not on file   Social Drivers of Health   Financial Resource Strain: Not on file  Food Insecurity: Not on file  Transportation Needs: Not on file  Physical Activity: Not on file  Stress: Not on file  Social Connections: Not on file     Family History: No family history of heart disease.  ROS:   Please see the history of present illness.     All other systems reviewed and are negative.  EKGs/Labs/Other Studies Reviewed:    The following studies were reviewed today:   EKG:   04/05/2023: Normal sinus rhythm, rate 79, nonspecific T wave flattening  Recent Labs: 03/30/2023: ALT 20; BUN 13; Creatinine, Ser 1.12; Hemoglobin 13.3; Platelets 247; Potassium 3.8; Sodium 137  Recent Lipid  Panel No results found for: "CHOL", "TRIG", "HDL", "CHOLHDL", "VLDL", "LDLCALC", "LDLDIRECT"  Physical Exam:    VS:  There were no vitals taken for this visit.    Wt Readings from Last 3 Encounters:  04/05/23 259 lb (117.5 kg)  03/30/23 250 lb (113.4 kg)  05/12/18 257 lb (116.6 kg)     GEN:  Well nourished, well developed in no acute distress HEENT: Normal NECK: No JVD; No carotid bruits LYMPHATICS: No lymphadenopathy CARDIAC: RRR, no murmurs, rubs, gallops RESPIRATORY:  Clear to auscultation without rales, wheezing or rhonchi  ABDOMEN: Soft, non-tender, non-distended MUSCULOSKELETAL:  No edema; No deformity  SKIN: Warm and dry NEUROLOGIC:  Alert and oriented x 3 PSYCHIATRIC:  Normal affect    ASSESSMENT:    No diagnosis found.  PLAN:    Syncope: Description suggests vasovagal syncope likely triggered by yellowjacket sting.  Discussed importance of lying on ground until symptoms pass if having prodromal symptoms.  Echocardiogram 04/30/2023 showed EF 60 to 65%, normal diastolic function, normal RV function, no significant valvular disease, mild dilatation of aortic root measuring 40 mm.  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  CTA chest in 1 year recommended to follow-up  RTC in 6 months***   Medication Adjustments/Labs and Tests Ordered: Current medicines are reviewed at length with the patient today.  Concerns regarding medicines are outlined above.  No orders of the defined types were placed in this encounter.  No orders of the defined types were placed in this encounter.   There are no Patient Instructions on file for this visit.   Signed, Little Ishikawa, MD  10/01/2023 5:50 AM    [facility]

## 2023-10-04 ENCOUNTER — Encounter: Payer: Self-pay | Admitting: Cardiology

## 2023-11-24 ENCOUNTER — Other Ambulatory Visit: Payer: Self-pay | Admitting: Medical Genetics

## 2024-07-17 ENCOUNTER — Other Ambulatory Visit: Payer: Self-pay | Admitting: Medical Genetics

## 2024-07-17 DIAGNOSIS — Z006 Encounter for examination for normal comparison and control in clinical research program: Secondary | ICD-10-CM
